# Patient Record
Sex: Female | Born: 1935 | Race: White | Hispanic: No | Marital: Married | Smoking: Former smoker
Health system: Southern US, Community
[De-identification: ages and names within clinical notes are randomized; demographics above are authoritative.]

## PROBLEM LIST (undated history)

## (undated) DIAGNOSIS — E039 Hypothyroidism, unspecified: Secondary | ICD-10-CM

## (undated) DIAGNOSIS — F0390 Unspecified dementia without behavioral disturbance: Secondary | ICD-10-CM

## (undated) DIAGNOSIS — H409 Unspecified glaucoma: Secondary | ICD-10-CM

## (undated) DIAGNOSIS — M810 Age-related osteoporosis without current pathological fracture: Secondary | ICD-10-CM

## (undated) DIAGNOSIS — E785 Hyperlipidemia, unspecified: Secondary | ICD-10-CM

## (undated) DIAGNOSIS — K219 Gastro-esophageal reflux disease without esophagitis: Secondary | ICD-10-CM

## (undated) DIAGNOSIS — I10 Essential (primary) hypertension: Secondary | ICD-10-CM

## (undated) DIAGNOSIS — N318 Other neuromuscular dysfunction of bladder: Secondary | ICD-10-CM

## (undated) DIAGNOSIS — L255 Unspecified contact dermatitis due to plants, except food: Secondary | ICD-10-CM

## (undated) DIAGNOSIS — M1990 Unspecified osteoarthritis, unspecified site: Secondary | ICD-10-CM

## (undated) DIAGNOSIS — H9190 Unspecified hearing loss, unspecified ear: Secondary | ICD-10-CM

## (undated) DIAGNOSIS — R32 Unspecified urinary incontinence: Secondary | ICD-10-CM

## (undated) DIAGNOSIS — K5730 Diverticulosis of large intestine without perforation or abscess without bleeding: Secondary | ICD-10-CM

## (undated) HISTORY — DX: Unspecified urinary incontinence: R32

## (undated) HISTORY — DX: Diverticulosis of large intestine without perforation or abscess without bleeding: K57.30

## (undated) HISTORY — PX: TOTAL KNEE ARTHROPLASTY: SHX125

## (undated) HISTORY — DX: Hyperlipidemia, unspecified: E78.5

## (undated) HISTORY — DX: Other neuromuscular dysfunction of bladder: N31.8

## (undated) HISTORY — PX: CORNEAL TRANSPLANT: SHX108

## (undated) HISTORY — DX: Hypothyroidism, unspecified: E03.9

## (undated) HISTORY — DX: Essential (primary) hypertension: I10

## (undated) HISTORY — DX: Unspecified hearing loss, unspecified ear: H91.90

## (undated) HISTORY — DX: Gastro-esophageal reflux disease without esophagitis: K21.9

## (undated) HISTORY — PX: ABDOMINAL HYSTERECTOMY: SHX81

## (undated) HISTORY — PX: KNEE ARTHROSCOPY: SHX127

## (undated) HISTORY — DX: Unspecified contact dermatitis due to plants, except food: L25.5

## (undated) HISTORY — DX: Age-related osteoporosis without current pathological fracture: M81.0

## (undated) HISTORY — PX: TONSILLECTOMY: SHX5217

## (undated) HISTORY — DX: Unspecified osteoarthritis, unspecified site: M19.90

---

## 1997-06-09 ENCOUNTER — Ambulatory Visit (HOSPITAL_COMMUNITY): Admission: RE | Admit: 1997-06-09 | Discharge: 1997-06-09 | Payer: Self-pay | Admitting: Specialist

## 2000-08-04 ENCOUNTER — Ambulatory Visit: Admission: RE | Admit: 2000-08-04 | Discharge: 2000-08-04 | Payer: Self-pay | Admitting: Orthopedic Surgery

## 2001-11-15 ENCOUNTER — Encounter: Payer: Self-pay | Admitting: Internal Medicine

## 2003-03-12 ENCOUNTER — Encounter: Payer: Self-pay | Admitting: Internal Medicine

## 2004-01-14 ENCOUNTER — Encounter: Payer: Self-pay | Admitting: Internal Medicine

## 2004-01-20 ENCOUNTER — Ambulatory Visit: Payer: Self-pay | Admitting: Internal Medicine

## 2004-05-11 ENCOUNTER — Ambulatory Visit: Payer: Self-pay | Admitting: Internal Medicine

## 2004-07-14 ENCOUNTER — Ambulatory Visit: Payer: Self-pay | Admitting: Internal Medicine

## 2004-10-29 ENCOUNTER — Ambulatory Visit: Payer: Self-pay | Admitting: Internal Medicine

## 2004-11-03 ENCOUNTER — Ambulatory Visit: Payer: Self-pay | Admitting: Internal Medicine

## 2004-12-17 ENCOUNTER — Ambulatory Visit: Payer: Self-pay | Admitting: Family Medicine

## 2005-01-01 ENCOUNTER — Ambulatory Visit: Payer: Self-pay | Admitting: Internal Medicine

## 2005-04-18 ENCOUNTER — Ambulatory Visit: Payer: Self-pay | Admitting: Internal Medicine

## 2005-04-26 ENCOUNTER — Ambulatory Visit: Payer: Self-pay | Admitting: Internal Medicine

## 2005-05-17 ENCOUNTER — Ambulatory Visit: Payer: Self-pay | Admitting: Internal Medicine

## 2005-06-15 ENCOUNTER — Inpatient Hospital Stay (HOSPITAL_COMMUNITY): Admission: RE | Admit: 2005-06-15 | Discharge: 2005-06-18 | Payer: Self-pay | Admitting: Orthopedic Surgery

## 2005-07-12 ENCOUNTER — Ambulatory Visit: Payer: Self-pay | Admitting: Internal Medicine

## 2005-08-08 ENCOUNTER — Ambulatory Visit: Payer: Self-pay | Admitting: Internal Medicine

## 2005-08-15 ENCOUNTER — Ambulatory Visit: Payer: Self-pay | Admitting: Internal Medicine

## 2005-09-06 ENCOUNTER — Ambulatory Visit: Payer: Self-pay | Admitting: Internal Medicine

## 2005-10-17 ENCOUNTER — Ambulatory Visit: Payer: Self-pay | Admitting: Internal Medicine

## 2005-12-28 ENCOUNTER — Ambulatory Visit: Payer: Self-pay | Admitting: Internal Medicine

## 2006-03-07 ENCOUNTER — Ambulatory Visit: Payer: Self-pay | Admitting: Internal Medicine

## 2006-06-15 ENCOUNTER — Ambulatory Visit (HOSPITAL_COMMUNITY): Admission: RE | Admit: 2006-06-15 | Discharge: 2006-06-15 | Payer: Self-pay | Admitting: Specialist

## 2006-07-11 ENCOUNTER — Ambulatory Visit: Payer: Self-pay | Admitting: Internal Medicine

## 2006-07-17 DIAGNOSIS — M810 Age-related osteoporosis without current pathological fracture: Secondary | ICD-10-CM | POA: Insufficient documentation

## 2006-07-17 DIAGNOSIS — E039 Hypothyroidism, unspecified: Secondary | ICD-10-CM | POA: Insufficient documentation

## 2006-07-17 HISTORY — DX: Hypothyroidism, unspecified: E03.9

## 2006-07-17 HISTORY — DX: Age-related osteoporosis without current pathological fracture: M81.0

## 2006-07-28 ENCOUNTER — Telehealth (INDEPENDENT_AMBULATORY_CARE_PROVIDER_SITE_OTHER): Payer: Self-pay

## 2006-08-04 ENCOUNTER — Ambulatory Visit: Payer: Self-pay | Admitting: Internal Medicine

## 2006-08-21 ENCOUNTER — Encounter: Payer: Self-pay | Admitting: Internal Medicine

## 2006-10-19 ENCOUNTER — Telehealth: Payer: Self-pay | Admitting: Internal Medicine

## 2006-11-16 ENCOUNTER — Ambulatory Visit: Payer: Self-pay | Admitting: Internal Medicine

## 2006-11-16 DIAGNOSIS — M1990 Unspecified osteoarthritis, unspecified site: Secondary | ICD-10-CM | POA: Insufficient documentation

## 2006-11-16 HISTORY — DX: Unspecified osteoarthritis, unspecified site: M19.90

## 2007-01-23 ENCOUNTER — Telehealth (INDEPENDENT_AMBULATORY_CARE_PROVIDER_SITE_OTHER): Payer: Self-pay | Admitting: *Deleted

## 2007-02-23 DIAGNOSIS — J069 Acute upper respiratory infection, unspecified: Secondary | ICD-10-CM | POA: Insufficient documentation

## 2007-03-01 ENCOUNTER — Ambulatory Visit: Payer: Self-pay | Admitting: Family Medicine

## 2007-03-15 ENCOUNTER — Telehealth: Payer: Self-pay | Admitting: Internal Medicine

## 2007-03-30 ENCOUNTER — Telehealth: Payer: Self-pay | Admitting: Internal Medicine

## 2007-05-04 ENCOUNTER — Ambulatory Visit: Payer: Self-pay | Admitting: Internal Medicine

## 2007-06-13 ENCOUNTER — Telehealth: Payer: Self-pay | Admitting: Internal Medicine

## 2007-07-13 ENCOUNTER — Ambulatory Visit: Payer: Self-pay | Admitting: Internal Medicine

## 2007-07-13 DIAGNOSIS — R1084 Generalized abdominal pain: Secondary | ICD-10-CM | POA: Insufficient documentation

## 2007-07-13 DIAGNOSIS — K5730 Diverticulosis of large intestine without perforation or abscess without bleeding: Secondary | ICD-10-CM | POA: Insufficient documentation

## 2007-07-13 HISTORY — DX: Diverticulosis of large intestine without perforation or abscess without bleeding: K57.30

## 2007-07-16 ENCOUNTER — Ambulatory Visit: Payer: Self-pay | Admitting: Cardiology

## 2007-07-16 ENCOUNTER — Ambulatory Visit: Payer: Self-pay | Admitting: Internal Medicine

## 2007-07-17 LAB — CONVERTED CEMR LAB
ALT: 15 units/L (ref 0–35)
Albumin: 3.3 g/dL — ABNORMAL LOW (ref 3.5–5.2)
Alkaline Phosphatase: 91 units/L (ref 39–117)
Amylase: 35 units/L (ref 27–131)
BUN: 13 mg/dL (ref 6–23)
Basophils Absolute: 0.1 10*3/uL (ref 0.0–0.1)
Basophils Relative: 0.9 % (ref 0.0–1.0)
Eosinophils Absolute: 0.1 10*3/uL (ref 0.0–0.7)
Eosinophils Relative: 1.9 % (ref 0.0–5.0)
GFR calc Af Amer: 90 mL/min
Glucose, Bld: 106 mg/dL — ABNORMAL HIGH (ref 70–99)
MCHC: 34 g/dL (ref 30.0–36.0)
MCV: 88.8 fL (ref 78.0–100.0)
Monocytes Relative: 10.4 % (ref 3.0–12.0)
Platelets: 385 10*3/uL (ref 150–400)
RDW: 12 % (ref 11.5–14.6)
Total Bilirubin: 0.4 mg/dL (ref 0.3–1.2)
Total Protein: 7 g/dL (ref 6.0–8.3)

## 2007-07-19 ENCOUNTER — Telehealth (INDEPENDENT_AMBULATORY_CARE_PROVIDER_SITE_OTHER): Payer: Self-pay

## 2007-07-19 ENCOUNTER — Ambulatory Visit: Payer: Self-pay | Admitting: Internal Medicine

## 2007-07-23 ENCOUNTER — Telehealth: Payer: Self-pay | Admitting: Internal Medicine

## 2007-07-27 ENCOUNTER — Telehealth: Payer: Self-pay | Admitting: Internal Medicine

## 2007-08-09 ENCOUNTER — Telehealth: Payer: Self-pay | Admitting: *Deleted

## 2007-08-10 ENCOUNTER — Ambulatory Visit: Payer: Self-pay | Admitting: Internal Medicine

## 2007-08-10 DIAGNOSIS — L255 Unspecified contact dermatitis due to plants, except food: Secondary | ICD-10-CM

## 2007-08-10 HISTORY — DX: Unspecified contact dermatitis due to plants, except food: L25.5

## 2007-09-27 ENCOUNTER — Telehealth: Payer: Self-pay | Admitting: Internal Medicine

## 2007-10-09 ENCOUNTER — Telehealth: Payer: Self-pay | Admitting: Internal Medicine

## 2007-10-31 ENCOUNTER — Telehealth (INDEPENDENT_AMBULATORY_CARE_PROVIDER_SITE_OTHER): Payer: Self-pay

## 2007-11-19 ENCOUNTER — Ambulatory Visit: Payer: Self-pay | Admitting: Internal Medicine

## 2007-11-19 DIAGNOSIS — E785 Hyperlipidemia, unspecified: Secondary | ICD-10-CM

## 2007-11-19 HISTORY — DX: Hyperlipidemia, unspecified: E78.5

## 2007-12-22 ENCOUNTER — Ambulatory Visit: Payer: Self-pay | Admitting: Family Medicine

## 2007-12-22 ENCOUNTER — Telehealth: Payer: Self-pay | Admitting: Family Medicine

## 2007-12-22 DIAGNOSIS — H01119 Allergic dermatitis of unspecified eye, unspecified eyelid: Secondary | ICD-10-CM | POA: Insufficient documentation

## 2008-02-12 ENCOUNTER — Ambulatory Visit: Payer: Self-pay | Admitting: Internal Medicine

## 2008-02-13 ENCOUNTER — Telehealth: Payer: Self-pay | Admitting: Internal Medicine

## 2008-02-21 ENCOUNTER — Telehealth: Payer: Self-pay | Admitting: Internal Medicine

## 2008-03-17 ENCOUNTER — Encounter: Payer: Self-pay | Admitting: Internal Medicine

## 2008-04-02 ENCOUNTER — Ambulatory Visit: Payer: Self-pay | Admitting: Internal Medicine

## 2008-04-11 ENCOUNTER — Telehealth: Payer: Self-pay | Admitting: Internal Medicine

## 2008-04-14 ENCOUNTER — Encounter: Payer: Self-pay | Admitting: Internal Medicine

## 2008-04-22 ENCOUNTER — Telehealth: Payer: Self-pay | Admitting: Internal Medicine

## 2008-04-23 ENCOUNTER — Ambulatory Visit: Payer: Self-pay | Admitting: Internal Medicine

## 2008-04-28 ENCOUNTER — Telehealth: Payer: Self-pay | Admitting: Internal Medicine

## 2008-08-07 ENCOUNTER — Emergency Department (HOSPITAL_COMMUNITY): Admission: EM | Admit: 2008-08-07 | Discharge: 2008-08-07 | Payer: Self-pay | Admitting: Family Medicine

## 2008-08-08 ENCOUNTER — Emergency Department (HOSPITAL_COMMUNITY): Admission: EM | Admit: 2008-08-08 | Discharge: 2008-08-08 | Payer: Self-pay | Admitting: Emergency Medicine

## 2008-08-10 ENCOUNTER — Emergency Department (HOSPITAL_COMMUNITY): Admission: EM | Admit: 2008-08-10 | Discharge: 2008-08-10 | Payer: Self-pay | Admitting: Family Medicine

## 2008-08-12 ENCOUNTER — Emergency Department (HOSPITAL_COMMUNITY): Admission: EM | Admit: 2008-08-12 | Discharge: 2008-08-12 | Payer: Self-pay | Admitting: Family Medicine

## 2008-08-12 ENCOUNTER — Telehealth: Payer: Self-pay | Admitting: Internal Medicine

## 2008-08-12 ENCOUNTER — Inpatient Hospital Stay (HOSPITAL_COMMUNITY): Admission: EM | Admit: 2008-08-12 | Discharge: 2008-08-14 | Payer: Self-pay | Admitting: Emergency Medicine

## 2008-08-13 ENCOUNTER — Ambulatory Visit: Payer: Self-pay | Admitting: Vascular Surgery

## 2008-08-13 ENCOUNTER — Encounter (INDEPENDENT_AMBULATORY_CARE_PROVIDER_SITE_OTHER): Payer: Self-pay | Admitting: Internal Medicine

## 2008-08-14 ENCOUNTER — Encounter (INDEPENDENT_AMBULATORY_CARE_PROVIDER_SITE_OTHER): Payer: Self-pay | Admitting: Internal Medicine

## 2008-08-21 ENCOUNTER — Ambulatory Visit: Payer: Self-pay | Admitting: Internal Medicine

## 2008-08-21 DIAGNOSIS — L02419 Cutaneous abscess of limb, unspecified: Secondary | ICD-10-CM

## 2008-08-21 DIAGNOSIS — L03119 Cellulitis of unspecified part of limb: Secondary | ICD-10-CM

## 2008-08-22 ENCOUNTER — Encounter: Payer: Self-pay | Admitting: Internal Medicine

## 2008-08-28 ENCOUNTER — Encounter: Payer: Self-pay | Admitting: Internal Medicine

## 2008-09-02 ENCOUNTER — Telehealth: Payer: Self-pay | Admitting: Internal Medicine

## 2008-09-11 ENCOUNTER — Telehealth: Payer: Self-pay | Admitting: Internal Medicine

## 2008-09-25 ENCOUNTER — Ambulatory Visit: Payer: Self-pay | Admitting: Internal Medicine

## 2008-09-25 DIAGNOSIS — K219 Gastro-esophageal reflux disease without esophagitis: Secondary | ICD-10-CM

## 2008-09-25 HISTORY — DX: Gastro-esophageal reflux disease without esophagitis: K21.9

## 2008-09-29 ENCOUNTER — Encounter (INDEPENDENT_AMBULATORY_CARE_PROVIDER_SITE_OTHER): Payer: Self-pay | Admitting: *Deleted

## 2008-12-04 ENCOUNTER — Encounter: Payer: Self-pay | Admitting: Internal Medicine

## 2008-12-09 ENCOUNTER — Ambulatory Visit: Payer: Self-pay | Admitting: Internal Medicine

## 2008-12-09 DIAGNOSIS — I10 Essential (primary) hypertension: Secondary | ICD-10-CM

## 2008-12-09 DIAGNOSIS — H9190 Unspecified hearing loss, unspecified ear: Secondary | ICD-10-CM

## 2008-12-09 HISTORY — DX: Unspecified hearing loss, unspecified ear: H91.90

## 2008-12-09 HISTORY — DX: Essential (primary) hypertension: I10

## 2008-12-15 ENCOUNTER — Ambulatory Visit: Payer: Self-pay | Admitting: Internal Medicine

## 2008-12-15 ENCOUNTER — Telehealth: Payer: Self-pay | Admitting: Internal Medicine

## 2008-12-15 LAB — CONVERTED CEMR LAB
Bilirubin Urine: NEGATIVE
Ketones, urine, test strip: NEGATIVE
Specific Gravity, Urine: 1.015
pH: 7

## 2008-12-16 ENCOUNTER — Telehealth (INDEPENDENT_AMBULATORY_CARE_PROVIDER_SITE_OTHER): Payer: Self-pay

## 2008-12-18 ENCOUNTER — Telehealth: Payer: Self-pay | Admitting: Internal Medicine

## 2009-03-04 ENCOUNTER — Encounter: Admission: RE | Admit: 2009-03-04 | Discharge: 2009-06-02 | Payer: Self-pay | Admitting: Family Medicine

## 2009-03-10 ENCOUNTER — Telehealth: Payer: Self-pay | Admitting: Internal Medicine

## 2009-04-15 ENCOUNTER — Ambulatory Visit: Payer: Self-pay | Admitting: Internal Medicine

## 2009-06-08 ENCOUNTER — Ambulatory Visit: Payer: Self-pay | Admitting: Internal Medicine

## 2009-06-08 DIAGNOSIS — N318 Other neuromuscular dysfunction of bladder: Secondary | ICD-10-CM

## 2009-06-08 DIAGNOSIS — H1030 Unspecified acute conjunctivitis, unspecified eye: Secondary | ICD-10-CM | POA: Insufficient documentation

## 2009-06-08 HISTORY — DX: Other neuromuscular dysfunction of bladder: N31.8

## 2009-06-09 ENCOUNTER — Encounter (INDEPENDENT_AMBULATORY_CARE_PROVIDER_SITE_OTHER): Payer: Self-pay | Admitting: *Deleted

## 2009-07-03 ENCOUNTER — Encounter (INDEPENDENT_AMBULATORY_CARE_PROVIDER_SITE_OTHER): Payer: Self-pay | Admitting: *Deleted

## 2009-07-08 ENCOUNTER — Telehealth: Payer: Self-pay | Admitting: Internal Medicine

## 2009-07-09 ENCOUNTER — Telehealth: Payer: Self-pay | Admitting: Internal Medicine

## 2009-07-13 ENCOUNTER — Telehealth: Payer: Self-pay | Admitting: Internal Medicine

## 2009-07-15 ENCOUNTER — Telehealth: Payer: Self-pay | Admitting: Internal Medicine

## 2009-09-02 ENCOUNTER — Ambulatory Visit: Payer: Self-pay | Admitting: Family Medicine

## 2009-09-02 DIAGNOSIS — R32 Unspecified urinary incontinence: Secondary | ICD-10-CM

## 2009-09-02 HISTORY — DX: Unspecified urinary incontinence: R32

## 2009-09-02 LAB — CONVERTED CEMR LAB
Glucose, Urine, Semiquant: NEGATIVE
Nitrite: NEGATIVE
Specific Gravity, Urine: 1.02
pH: 5

## 2009-09-03 ENCOUNTER — Encounter: Payer: Self-pay | Admitting: Internal Medicine

## 2009-09-04 ENCOUNTER — Telehealth: Payer: Self-pay | Admitting: Internal Medicine

## 2009-09-05 ENCOUNTER — Emergency Department (HOSPITAL_COMMUNITY): Admission: EM | Admit: 2009-09-05 | Discharge: 2009-09-05 | Payer: Self-pay | Admitting: Family Medicine

## 2009-09-15 ENCOUNTER — Telehealth: Payer: Self-pay | Admitting: Internal Medicine

## 2009-10-12 ENCOUNTER — Emergency Department (HOSPITAL_COMMUNITY): Admission: EM | Admit: 2009-10-12 | Discharge: 2009-10-13 | Payer: Self-pay | Admitting: Emergency Medicine

## 2009-10-14 ENCOUNTER — Ambulatory Visit: Payer: Self-pay | Admitting: Internal Medicine

## 2009-11-25 ENCOUNTER — Telehealth: Payer: Self-pay | Admitting: Internal Medicine

## 2009-12-15 ENCOUNTER — Ambulatory Visit: Payer: Self-pay | Admitting: Internal Medicine

## 2009-12-15 ENCOUNTER — Telehealth: Payer: Self-pay | Admitting: Internal Medicine

## 2009-12-15 ENCOUNTER — Encounter: Payer: Self-pay | Admitting: Internal Medicine

## 2009-12-15 LAB — CONVERTED CEMR LAB
ALT: 16 units/L (ref 0–35)
AST: 24 units/L (ref 0–37)
Basophils Absolute: 0 10*3/uL (ref 0.0–0.1)
CO2: 31 meq/L (ref 19–32)
Chloride: 104 meq/L (ref 96–112)
Cholesterol: 212 mg/dL — ABNORMAL HIGH (ref 0–200)
Creatinine, Ser: 0.7 mg/dL (ref 0.4–1.2)
Glucose, Bld: 101 mg/dL — ABNORMAL HIGH (ref 70–99)
HDL: 81.5 mg/dL (ref >39.00)
MCHC: 33.9 g/dL (ref 30.0–36.0)
MCV: 92.8 fL (ref 78.0–100.0)
Monocytes Absolute: 0.5 10*3/uL (ref 0.1–1.0)
Monocytes Relative: 7.4 % (ref 3.0–12.0)
Neutro Abs: 4.8 10*3/uL (ref 1.4–7.7)
Neutrophils Relative %: 65.2 % (ref 43.0–77.0)
Total Bilirubin: 0.4 mg/dL (ref 0.3–1.2)
VLDL: 16.6 mg/dL (ref 0.0–40.0)

## 2009-12-18 ENCOUNTER — Telehealth: Payer: Self-pay | Admitting: Internal Medicine

## 2009-12-30 ENCOUNTER — Telehealth: Payer: Self-pay | Admitting: Internal Medicine

## 2010-02-04 ENCOUNTER — Telehealth: Payer: Self-pay

## 2010-02-07 ENCOUNTER — Encounter: Payer: Self-pay | Admitting: Internal Medicine

## 2010-02-14 LAB — CONVERTED CEMR LAB
AST: 19 units/L (ref 0–37)
AST: 20 units/L (ref 0–37)
AST: 22 units/L (ref 0–37)
Albumin: 3.8 g/dL (ref 3.5–5.2)
Alkaline Phosphatase: 67 units/L (ref 39–117)
Alkaline Phosphatase: 74 units/L (ref 39–117)
BUN: 11 mg/dL (ref 6–23)
Basophils Absolute: 0 10*3/uL (ref 0.0–0.1)
Basophils Absolute: 0 10*3/uL (ref 0.0–0.1)
Basophils Relative: 0.5 % (ref 0.0–3.0)
Bilirubin, Direct: 0 mg/dL (ref 0.0–0.3)
Bilirubin, Direct: 0.1 mg/dL (ref 0.0–0.3)
Bilirubin, Direct: 0.1 mg/dL (ref 0.0–0.3)
CO2: 31 meq/L (ref 19–32)
Calcium: 9 mg/dL (ref 8.4–10.5)
Chloride: 106 meq/L (ref 96–112)
Chloride: 107 meq/L (ref 96–112)
Cholesterol: 259 mg/dL (ref 0–200)
Creatinine, Ser: 0.8 mg/dL (ref 0.4–1.2)
Direct LDL: 182.3 mg/dL
Eosinophils Absolute: 0.1 10*3/uL (ref 0.0–0.7)
Eosinophils Relative: 1 % (ref 0.0–5.0)
GFR calc Af Amer: 90 mL/min
GFR calc non Af Amer: 64.6 mL/min (ref >60)
GFR calc non Af Amer: 74 mL/min
Glucose, Bld: 123 mg/dL — ABNORMAL HIGH (ref 70–99)
HDL: 70.3 mg/dL (ref >39.0)
Hemoglobin: 13 g/dL (ref 12.0–15.0)
Hemoglobin: 13.3 g/dL (ref 12.0–15.0)
Lymphocytes Relative: 32.6 % (ref 12.0–46.0)
Lymphs Abs: 1.8 10*3/uL (ref 0.7–4.0)
MCHC: 33.6 g/dL (ref 30.0–36.0)
MCHC: 35 g/dL (ref 30.0–36.0)
MCV: 91.1 fL (ref 78.0–100.0)
MCV: 92.7 fL (ref 78.0–100.0)
Monocytes Absolute: 0.5 10*3/uL (ref 0.1–1.0)
Monocytes Absolute: 0.7 10*3/uL (ref 0.2–0.7)
Monocytes Relative: 8.9 % (ref 3.0–12.0)
Monocytes Relative: 9.5 % (ref 3.0–12.0)
Neutro Abs: 2.8 10*3/uL (ref 1.4–7.7)
Neutrophils Relative %: 53.4 % (ref 43.0–77.0)
Platelets: 248 10*3/uL (ref 150–400)
Potassium: 3.8 meq/L (ref 3.5–5.1)
Potassium: 4.9 meq/L (ref 3.5–5.1)
RBC: 4.15 M/uL (ref 3.87–5.11)
RBC: 4.26 M/uL (ref 3.87–5.11)
RDW: 12.2 % (ref 11.5–14.6)
TSH: 9.95 microintl units/mL — ABNORMAL HIGH (ref 0.35–5.50)
Total Bilirubin: 0.5 mg/dL (ref 0.3–1.2)
Total Bilirubin: 0.6 mg/dL (ref 0.3–1.2)
Total CHOL/HDL Ratio: 3
Total Protein: 6.6 g/dL (ref 6.0–8.3)
Total Protein: 6.8 g/dL (ref 6.0–8.3)
Triglycerides: 118 mg/dL (ref 0.0–149.0)
Triglycerides: 122 mg/dL (ref 0–149)
VLDL: 24 mg/dL (ref 0–40)
WBC: 5.2 10*3/uL (ref 4.5–10.5)

## 2010-02-16 ENCOUNTER — Telehealth: Payer: Self-pay | Admitting: Internal Medicine

## 2010-02-16 NOTE — Progress Notes (Signed)
Summary: sleeping med   Phone Note Call from Patient   Caller: Patient Call For: Gordy Savers  MD Reason for Call: Talk to Doctor Summary of Call: pt forgot to ask for sleeping med at appt today - would like something "light" . If ok - call out to [facility] [address]  please advise Initial call taken by: Duard Brady LPN,  December 15, 2009 10:15 AM  Follow-up for Phone Call        generic ambien 5 mg  #30 use HS as needed sleep Follow-up by: Gordy Savers  MD,  December 15, 2009 12:50 PM  Additional Follow-up for Phone Call Additional follow up Details #1::        med list changed - [facility] - attempt to call pt - no ans no mach. KIK Additional Follow-up by: Duard Brady LPN,  December 15, 2009 1:36 PM    New/Updated Medications: AMBIEN 5 MG TABS (ZOLPIDEM TARTRATE) 1 by mouth at bedtime as needed sleep Prescriptions: AMBIEN 5 MG TABS (ZOLPIDEM TARTRATE) 1 by mouth at bedtime as needed sleep  #30 x 0   Entered by:   Duard Brady LPN   Authorized by:   Gordy Savers  MD   Signed by:   Duard Brady LPN on 16/10/9602   Method used:   Historical   RxID:   [id number]

## 2010-02-16 NOTE — Progress Notes (Signed)
Summary: Pt req refill of generic Aricept 10mg  1 qd to [facility] Note Call from Patient Call back at [facility] Phone [phone]   Caller: Patient Summary of Call: Pt called and has been taken generic Aricept 10mg  1 qd  and pt says that it is helping her. Pt only has 4 pills lft. Pt is wanting to know know if she should continue taking med. If so, pt is req a script be called in to [facility] on [address].  Initial call taken by: Lucy Antigua,  November 25, 2009 3:22 PM  Follow-up for Phone Call        generic aricept 10 mg #90 one daily  RF 4 Follow-up by: Gordy Savers  MD,  November 26, 2009 8:22 AM  Additional Follow-up for Phone Call Additional follow up Details #1::        change to med list and [facility]. KIK Additional Follow-up by: Duard Brady LPN,  November 26, 2009 8:47 AM    New/Updated Medications: ARICEPT 10 MG TABS (DONEPEZIL HCL) qd Prescriptions: ARICEPT 10 MG TABS (DONEPEZIL HCL) qd  #90 x 4   Entered by:   Duard Brady LPN   Authorized by:   Gordy Savers  MD   Signed by:   Duard Brady LPN on 47/82/9562   Method used:   Faxed to ...       [facility]  [address]  [phone]* (retail)       [address]       [place], [place]  [address]       Ph: [phone] or [phone]       Fax: [phone]   RxID:   [id number]

## 2010-02-16 NOTE — Progress Notes (Signed)
Summary: rx for ditropan  Phone Note Call from Patient   Caller: Patient Call For: Gordy Savers  MD Summary of Call: needs rx for Ditropan called to [facility] on [address]. Vesicare samples gone , but to expensive.  [phone] Initial call taken by: Duard Brady LPN,  July 09, 2009 12:05 PM  Follow-up for Phone Call        1 box of samples given - pt aware Dr. Amador Cunas out of office until monday. KIK Follow-up by: Duard Brady LPN,  July 09, 2009 12:18 PM  Additional Follow-up for Phone Call Additional follow up Details #1::        ditropan xl 10 mg  #90 one every am  RF 4 Additional Follow-up by: Gordy Savers  MD,  July 12, 2009 8:17 PM    New/Updated Medications: DITROPAN XL 10 MG XR24H-TAB (OXYBUTYNIN CHLORIDE) qam Prescriptions: DITROPAN XL 10 MG XR24H-TAB (OXYBUTYNIN CHLORIDE) qam  #90 x 4   Entered by:   Duard Brady LPN   Authorized by:   Gordy Savers  MD   Signed by:   Duard Brady LPN on 29/52/8413   Method used:   Electronically to        [facility]  [phone]* (retail)       [address]       [place], [place]  [address]       Ph: [phone] or [phone]       Fax: [phone]   RxID:   [id number]   Appended Document: rx for ditropan med added to med list , [facility]. KIK

## 2010-02-16 NOTE — Letter (Signed)
Summary: Previsit letter  [facility]  [address] [place], [place] [address]   Phone: [phone]  Fax: [phone]       07/03/2009 MRN: [id number]  [name] [address] [place], [place]  [address]  Dear Ms. Linnen,  Welcome to the Gastroenterology Division at [facility].    You are scheduled to see a nurse for your pre-procedure visit on 07-24-09 at 1:30p.m. on the 3rd floor at [facility], [address].  We ask that you try to arrive at our office 15 minutes prior to your appointment time to allow for check-in.  Your nurse visit will consist of discussing your medical and surgical history, your immediate family medical history, and your medications.    Please bring a complete list of all your medications or, if you prefer, bring the medication bottles and we will list them.  We will need to be aware of both prescribed and over the counter drugs.  We will need to know exact dosage information as well.  If you are on blood thinners (Coumadin, Plavix, Aggrenox, Ticlid, etc.) please call our office today/prior to your appointment, as we need to consult with your physician about holding your medication.   Please be prepared to read and sign documents such as consent forms, a financial agreement, and acknowledgement forms.  If necessary, and with your consent, a friend or relative is welcome to sit-in on the nurse visit with you.  Please bring your insurance card so that we may make a copy of it.  If your insurance requires a referral to see a specialist, please bring your referral form from your primary care physician.  No co-pay is required for this nurse visit.     If you cannot keep your appointment, please call [phone] to cancel or reschedule prior to your appointment date.  This allows [name] the opportunity to schedule an appointment for another patient in need of care.    Thank you for choosing [facility] for your medical needs.  We  appreciate the opportunity to care for you.  Please visit [name] at our website  to learn more about our practice.                     Sincerely.                                                                                                                   The Gastroenterology Division

## 2010-02-16 NOTE — Assessment & Plan Note (Signed)
Summary: pt will come in fasting/njr   Vital Signs:  Patient profile:   77 year old female Height:      59.5 inches Weight:      125 pounds BMI:     24.91 Temp:     98.1 degrees F oral BP sitting:   120 / 70  (left arm) Cuff size:   large  Vitals Entered By: Duard Brady LPN (December 15, 2009 8:48 AM) CC: cpx - doing ok Is Patient Diabetic? No   CC:  cpx - doing ok.  History of Present Illness: 77 year old patient who is seen today for a comprehensive evaluation.  Medical problems include dyslipidemia, gastroesophageal reflux disease, and hypothyroidism.  She also has a history of overactive bladder, and mild dementia.  She is followed [facility] due to glaucoma. Here for Medicare AWV:  1.   Risk factors based on Past M, S, F history:  bivascular risk factors includedyslipidemia, and hypertension 2.   Physical Activities: remains very active.  No exercise limitations 3.   Depression/mood: no history of depression or mood disorder 4.   Hearing: moderately impaired ; uses a hearing aid on the left 5.   ADL's: independent in all aspects of daily living 6.   Fall Risk: moderate 7.   Home Safety: no problems identified 8.   Height, weight, &visual acuity:height and weight are stable.  She is on biphosphonate for osteo- porosis.  Is followed closely by ophthalmology 9.   Counseling: exercise heart healthy diet.  Discussed 10.   Labs ordered based on risk factors: laboratory profile, including lipid panel, and TSH will be reviewed. 11.           Referral Coordination- not appropriate at this time 12.           Care Plan- heart healthy diet, calcium and vitamin D supplementation, exercise regimen.  All encouraged 13.            Cognitive Assessment- patient has a history of mild cognitive impairment.  Treated with Aricept   Allergies: 1)  ! Codeine 2)  ! * Plastic Adhesive  Past History:  Past Medical History: Reviewed history from 12/09/2008 and no changes  required. Hypothyroidism Osteoporosis DJD Mild Hypercholesterolemia Menopausal syndrome Osteoarthritis Diverticulosis, colon peripheral vascular occlusive diseaseand glaucoma chronic microvascular changes of the brain history of polysubstance abuse Hyperlipidemia Overactive Bladder partial deafness Hypertension  Past Surgical History: Reviewed history from 11/19/2007 and no changes required. Hysterectomy-1982 Tonsillectomy Rt knee arthroscopic sx-2004 G 1 P 1 Lt corneal implant Colonoscopy- 11/15/2001 multiple corneal transplants left eye right totally replacement surgery, November 2007  Family History: Reviewed history from 11/16/2006 and no changes required. Fam hx COPD Fam hx MI Fam hx Brain stem stroke  father died age 45, MI mother died age 22 complications of COPD, history of osteoporosis  One brother died age 51 brainstem stroke one sister is 69 in good health  Social History: Reviewed history from 11/19/2007 and no changes required. Former Smoker Married  Review of Systems  The patient denies anorexia, fever, weight loss, weight gain, vision loss, decreased hearing, hoarseness, chest pain, syncope, dyspnea on exertion, peripheral edema, prolonged cough, headaches, hemoptysis, abdominal pain, melena, hematochezia, severe indigestion/heartburn, hematuria, incontinence, genital sores, muscle weakness, suspicious skin lesions, transient blindness, difficulty walking, depression, unusual weight change, abnormal bleeding, enlarged lymph nodes, angioedema, and breast masses.    Physical Exam  General:  Well-developed,well-nourished,in no acute distress; alert,appropriate and cooperative throughout examination; blood pressure 120/70 Head:  Normocephalic and atraumatic without obvious abnormalities. No apparent alopecia or balding. Eyes:  No corneal or conjunctival inflammation noted. EOMI. Perrla. Funduscopic exam benign, without hemorrhages, exudates or  papilledema. Vision grossly normal. Ears:  External ear exam shows no significant lesions or deformities.  Otoscopic examination reveals clear canals, tympanic membranes are intact bilaterally without bulging, retraction, inflammation or discharge. hearing aid present on the left Nose:  External nasal examination shows no deformity or inflammation. Nasal mucosa are pink and moist without lesions or exudates. Mouth:  Oral mucosa and oropharynx without lesions or exudates.  Teeth in good repair. Neck:  No deformities, masses, or tenderness noted. Chest Wall:  No deformities, masses, or tenderness noted. Breasts:  No mass, nodules, thickening, tenderness, bulging, retraction, inflamation, nipple discharge or skin changes noted.   Lungs:  Normal respiratory effort, chest expands symmetrically. Lungs are clear to auscultation, no crackles or wheezes. Heart:  Normal rate and regular rhythm. S1 and S2 normal without gallop, murmur, click, rub or other extra sounds. Rectal:  No external abnormalities noted. Normal sphincter tone. No rectal masses or tenderness. Genitalia:  status post hysterectomy Msk:  No deformity or scoliosis noted of thoracic or lumbar spine.   Pulses:  R and L carotid,radial,femoral,dorsalis pedis and posterior tibial pulses are full and equal bilaterally Extremities:  No clubbing, cyanosis, edema, or deformity noted with normal full range of motion of all joints.   Neurologic:  No cranial nerve deficits noted. Station and gait are normal. Plantar reflexes are down-going bilaterally. DTRs are symmetrical throughout. Sensory, motor and coordinative functions appear intact. Skin:  Intact without suspicious lesions or rashes Cervical Nodes:  No lymphadenopathy noted Axillary Nodes:  No palpable lymphadenopathy Inguinal Nodes:  No significant adenopathy Psych:  Cognition and judgment appear intact. Alert and cooperative with normal attention span and concentration. No apparent delusions,  illusions, hallucinations   Impression & Recommendations:  Problem # 1:  PREVENTIVE HEALTH CARE (ICD-V70.0)  Orders: Medicare -1st Annual Wellness Visit [phone])  Problem # 2:  HYPERTENSION (ICD-401.9)  The following medications were removed from the medication list:    Amlodipine Besylate 2.5 Mg Tabs (Amlodipine besylate) ..... One daily  Orders: EKG w/ Interpretation (93000) Venipuncture (96295) TLB-BMP (Basic Metabolic Panel-BMET) (80048-METABOL) TLB-CBC Platelet - w/Differential (85025-CBCD) TLB-Hepatic/Liver Function Pnl (80076-HEPATIC)  The following medications were removed from the medication list:    Amlodipine Besylate 2.5 Mg Tabs (Amlodipine besylate) ..... One daily  Problem # 3:  OSTEOARTHRITIS (ICD-715.90)  Her updated medication list for this problem includes:    Tylenol Extra Strength 500 Mg Tabs (Acetaminophen) .Marland Kitchen... Three times a day    Ibuprofen 200 Mg Tabs (Ibuprofen) .Marland Kitchen... Three times a day as needed    Aspirin 81 Mg Tbec (Aspirin) ..... Once daily  Her updated medication list for this problem includes:    Tylenol Extra Strength 500 Mg Tabs (Acetaminophen) .Marland Kitchen... Three times a day    Ibuprofen 200 Mg Tabs (Ibuprofen) .Marland Kitchen... Three times a day as needed    Aspirin 81 Mg Tbec (Aspirin) ..... Once daily  Orders: Venipuncture (28413) TLB-BMP (Basic Metabolic Panel-BMET) (80048-METABOL) TLB-CBC Platelet - w/Differential (85025-CBCD) TLB-Hepatic/Liver Function Pnl (80076-HEPATIC)  Problem # 4:  HYPOTHYROIDISM (ICD-244.9)  Her updated medication list for this problem includes:    Synthroid 100 Mcg Tabs (Levothyroxine sodium) .Marland Kitchen... 1 once daily  Her updated medication list for this problem includes:    Synthroid 100 Mcg Tabs (Levothyroxine sodium) .Marland Kitchen... 1 once daily  Orders: Venipuncture (24401) TLB-BMP (Basic Metabolic Panel-BMET) (  80048-METABOL) TLB-CBC Platelet - w/Differential (85025-CBCD) TLB-Hepatic/Liver Function Pnl (80076-HEPATIC) TLB-TSH  (Thyroid Stimulating Hormone) (84443-TSH)  Problem # 5:  OVERACTIVE BLADDER (ICD-596.51)  Problem # 6:  HYPERLIPIDEMIA (ICD-272.4)  Her updated medication list for this problem includes:    Simvastatin 10 Mg Tabs (Simvastatin) .Marland Kitchen... Take 1 tablet by mouth at bedtime  Orders: EKG w/ Interpretation (93000) TLB-Lipid Panel (80061-LIPID)  Her updated medication list for this problem includes:    Simvastatin 10 Mg Tabs (Simvastatin) .Marland Kitchen... Take 1 tablet by mouth at bedtime  Complete Medication List: 1)  Allegra 180 Mg Tabs (Fexofenadine hcl) .Marland Kitchen.. 1 once daily as needed 2)  Alendronate Sodium 70 Mg Tabs (Alendronate sodium) .... One by mouth weekly 3)  Miralax Powd (Polyethylene glycol 3350) .... One  scoop in 12oz water twice daily as needed 4)  Tylenol Extra Strength 500 Mg Tabs (Acetaminophen) .... Three times a day 5)  Ibuprofen 200 Mg Tabs (Ibuprofen) .... Three times a day as needed 6)  Simvastatin 10 Mg Tabs (Simvastatin) .... Take 1 tablet by mouth at bedtime 7)  Aspirin 81 Mg Tbec (Aspirin) .... Once daily 8)  Calcium 500 +d 500-400 Mg-unit Tabs (Calcium-vitamin d) .... Two times a day 9)  Melatonin 3 Mg Tabs (Melatonin) .... Take 1 tablet by mouth once a day 10)  Multivitamins Tabs (Multiple vitamin) .... Once daily 11)  Fish Oil 300 Mg Caps (Omega-3 fatty acids) .... Takes 1000iu once daily 12)  Vitamin D3 1000 Unit Caps (Cholecalciferol) .... Once daily 13)  Synthroid 100 Mcg Tabs (Levothyroxine sodium) .Marland Kitchen.. 1 once daily 14)  Ditropan Xl 10 Mg Xr24h-tab (Oxybutynin chloride) .... Qam 15)  Aricept 10 Mg Tabs (Donepezil hcl) .... Qd  Patient Instructions: 1)  Please schedule a follow-up appointment in 6 months. 2)  Limit your Sodium (Salt). 3)  It is important that you exercise regularly at least 20 minutes 5 times a week. If you develop chest pain, have severe difficulty breathing, or feel very tired , stop exercising immediately and seek medical attention. 4)  Take calcium  +Vitamin D daily. Prescriptions: ARICEPT 10 MG TABS (DONEPEZIL HCL) qd  #90 x 6   Entered and Authorized by:   Gordy Savers  MD   Signed by:   Gordy Savers  MD on 12/15/2009   Method used:   Electronically to        [facility]  [phone]* (retail)       [address]       [place], [place]  [address]       Ph: [phone] or [phone]       Fax: [phone]   RxID:   [id number] DITROPAN XL 10 MG XR24H-TAB (OXYBUTYNIN CHLORIDE) qam  #90 x 6   Entered and Authorized by:   Gordy Savers  MD   Signed by:   Gordy Savers  MD on 12/15/2009   Method used:   Electronically to        [facility]  [phone]* (retail)       [address]       [place], [place]  [address]       Ph: [phone] or [phone]       Fax: [phone]   RxID:   [id number] SYNTHROID 100 MCG TABS (LEVOTHYROXINE SODIUM) 1 once daily  #90 x 6   Entered and Authorized by:  Gordy Savers  MD   Signed by:   Gordy Savers  MD on 12/15/2009   Method used:   Electronically to        [facility]  [phone]* (retail)       [address]       [place], [place]  [address]       Ph: [phone] or [phone]       Fax: [phone]   RxID:   [id number] SIMVASTATIN 10 MG TABS (SIMVASTATIN) Take 1 tablet by mouth at bedtime  #90 x 6   Entered and Authorized by:   Gordy Savers  MD   Signed by:   Gordy Savers  MD on 12/15/2009   Method used:   Electronically to        [facility]  [phone]* (retail)       [address]       [place], [place]  [address]       Ph: [phone] or [phone]       Fax: [phone]   RxID:   [id number]    Orders Added: 1)  EKG w/ Interpretation [93000] 2)  Medicare -1st Annual Wellness Visit [G0438] 3)  Est. Patient Level III [83151] 4)  Venipuncture [76160] 5)  TLB-Lipid  Panel [80061-LIPID] 6)  TLB-BMP (Basic Metabolic Panel-BMET) [80048-METABOL] 7)  TLB-CBC Platelet - w/Differential [85025-CBCD] 8)  TLB-Hepatic/Liver Function Pnl [80076-HEPATIC] 9)  TLB-TSH (Thyroid Stimulating Hormone) [73710-GYI]

## 2010-02-16 NOTE — Progress Notes (Signed)
Summary: med question  Phone Note Call from Patient Call back at Home Phone [phone]   Caller: Patient---live call Summary of Call: On Oxybutynin 10mg  1qm. pt says that it is too strong. She would like to know if she can cut in half and 5 mg 1qm? She stated that she been cutting them in half for the past 2 days. Is this ok? Initial call taken by: Warnell Forester,  July 15, 2009 12:13 PM  Follow-up for Phone Call        ok Follow-up by: Gordy Savers  MD,  July 15, 2009 12:46 PM  Additional Follow-up for Phone Call Additional follow up Details #1::        lmaom as indicated above. Additional Follow-up by: Warnell Forester,  July 15, 2009 12:50 PM

## 2010-02-16 NOTE — Assessment & Plan Note (Signed)
Summary: FLU INJ/PS   Nurse Visit   Allergies: 1)  ! Codeine 2)  ! * Plastic Adhesive  Immunizations Administered:  Influenza Vaccine # 1:    Vaccine Type: Fluvax MCR    Site: left deltoid    Mfr: GlaxoSmithKline    Dose: 0.5 ml    Route: IM    Given by: Kern Reap CMA (AAMA)    Exp. Date: 07/17/2010    Lot #: [id number]    VIS given: 08/11/09 version given October 14, 2009.    Physician counseled: yes  Orders Added: 1)  Influenza Vaccine MCR [00025]

## 2010-02-16 NOTE — Letter (Signed)
Summary: Previsit letter  [facility]  [address] [place], [place] [address]   Phone: [phone]  Fax: [phone]       06/09/2009 MRN: [id number]  [name] [address] [place], [place]  [address]  Dear [name],  Welcome to the Gastroenterology Division at [facility].    You are scheduled to see a nurse for your pre-procedure visit on 07/22/2009 at 8:00am on the 3rd floor at [facility], [address].  We ask that you try to arrive at our office 15 minutes prior to your appointment time to allow for check-in.  Your nurse visit will consist of discussing your medical and surgical history, your immediate family medical history, and your medications.    Please bring a complete list of all your medications or, if you prefer, bring the medication bottles and we will list them.  We will need to be aware of both prescribed and over the counter drugs.  We will need to know exact dosage information as well.  If you are on blood thinners (Coumadin, Plavix, Aggrenox, Ticlid, etc.) please call our office today/prior to your appointment, as we need to consult with your physician about holding your medication.   Please be prepared to read and sign documents such as consent forms, a financial agreement, and acknowledgement forms.  If necessary, and with your consent, a friend or relative is welcome to sit-in on the nurse visit with you.  Please bring your insurance card so that we may make a copy of it.  If your insurance requires a referral to see a specialist, please bring your referral form from your primary care physician.  No co-pay is required for this nurse visit.     If you cannot keep your appointment, please call [phone] to cancel or reschedule prior to your appointment date.  This allows [name] the opportunity to schedule an appointment for another patient in need of care.    Thank you for choosing [facility] for your medical needs.  We  appreciate the opportunity to care for you.  Please visit [name] at our website  to learn more about our practice.                     Sincerely.                                                                                                                   The Gastroenterology Division

## 2010-02-16 NOTE — Progress Notes (Signed)
Summary: refax to [facility] simvastatin  Phone Note Refill Request Message from:  Fax from Pharmacy on December 18, 2009 3:11 PM  Refills Requested: Medication #1:  SIMVASTATIN 10 MG TABS Take 1 tablet by mouth at bedtime [facility]   Method Requested: Fax to Local Pharmacy Initial call taken by: Duard Brady LPN,  December 18, 2009 3:11 PM  Follow-up for Phone Call        was sent to [facility] at 11/29 OV - will refax to [facility]    KIK Follow-up by: Duard Brady LPN,  December 18, 2009 3:13 PM

## 2010-02-16 NOTE — Progress Notes (Signed)
Summary: Pt req a refill of Cipro 250mg  to [facility] Note Call from Patient Call back at [facility] Phone [phone]   Caller: Patient Summary of Call: Pt called req UTI, Dr. Rogelia Rohrer prescribed 3 days worth of med, but pt is req Ciprofloxacin 250mg . Pls call in to [facility] on [address]. Pt is out of med and is needing more. Pt says she still has uti.    Pt is req to be notified.  Initial call taken by: Lucy Antigua,  September 04, 2009 4:14 PM  Follow-up for Phone Call        #14 Follow-up by: Gordy Savers  MD,  September 04, 2009 5:31 PM  Additional Follow-up for Phone Call Additional follow up Details #1::        pt aware rx sent to [facility]. kik Additional Follow-up by: Duard Brady LPN,  September 04, 2009 5:41 PM    Prescriptions: CIPRO 500 MG TABS (CIPROFLOXACIN HCL) 1 by mouth two times a day for 7 days  #14 x 0   Entered by:   Duard Brady LPN   Authorized by:   Gordy Savers  MD   Signed by:   Duard Brady LPN on 32/44/0102   Method used:   Electronically to        [facility]  [phone]* (retail)       [address]       [place], [place]  [address]       Ph: [phone] or [phone]       Fax: [phone]   RxID:   [id number]

## 2010-02-16 NOTE — Progress Notes (Signed)
Summary: samples  Phone Note Call from Patient Call back at Home Phone [phone]   Caller: Patient--live call Summary of Call: need more samples of Vesicare. willmake apt. Initial call taken by: Warnell Forester,  July 08, 2009 2:20 PM  Follow-up for Phone Call        see phone note from 6/23 KIK Follow-up by: Duard Brady LPN,  July 09, 2009 12:12 PM

## 2010-02-16 NOTE — Progress Notes (Signed)
Summary: refill  Phone Note Refill Request Message from:  Fax from Pharmacy  Refills Requested: Medication #1:  SYNTHROID 100 MCG TABS 1 once daily   Brand Name Necessary? No [facility] fax---[phone]  Initial call taken by: Warnell Forester,  March 10, 2009 9:12 AM    Prescriptions: SYNTHROID 100 MCG TABS (LEVOTHYROXINE SODIUM) 1 once daily  #90 x 3   Entered by:   Duard Brady LPN   Authorized by:   Gordy Savers  MD   Signed by:   Duard Brady LPN on 98/11/9145   Method used:   Electronically to        [facility]* (mail-order)             ,          Ph: [phone]       Fax: [phone]   RxID:   [id number]

## 2010-02-16 NOTE — Progress Notes (Signed)
Summary: cipro for uti  Phone Note Call from Patient Call back at Home Phone [phone]   Caller: Patient Summary of Call: Pt called to get a refill of that med that was prescribed for uti. Pt is req this to be called in to [facility] on [address].    Initial call taken by: Lucy Antigua,  July 13, 2009 11:53 AM  Follow-up for Phone Call        called pt to discuss signs - c/o itching and burning with urination. states it feels like the other infections i have had before.  Will forward to Dr. Amador Cunas to see if he will call out med.   Pt is also aware that we called otu ditropan rx this AM too.  Follow-up by: Duard Brady LPN,  July 13, 2009 12:09 PM  Additional Follow-up for Phone Call Additional follow up Details #1::        generic cipro  500 #14 one BID Additional Follow-up by: Gordy Savers  MD,  July 13, 2009 1:00 PM    Additional Follow-up for Phone Call Additional follow up Details #2::    added to med list , efilled to [facility] - attempt to call pt - no ans no mach.  KIK Follow-up by: Duard Brady LPN,  July 13, 2009 3:52 PM  Additional Follow-up for Phone Call Additional follow up Details #3:: Details for Additional Follow-up Action Taken: attempt to call - ans mach - LMTCB  med called to [facility]. KIK  New/Updated Medications: CIPRO 500 MG TABS (CIPROFLOXACIN HCL) 1 by mouth two times a day for 7 days Prescriptions: CIPRO 500 MG TABS (CIPROFLOXACIN HCL) 1 by mouth two times a day for 7 days  #14 x 0   Entered by:   Duard Brady LPN   Authorized by:   Gordy Savers  MD   Signed by:   Duard Brady LPN on 40/34/7425   Method used:   Electronically to        [facility]  [phone]* (retail)       [address]       [place], [place]  [address]       Ph: [phone] or [phone]       Fax: [phone]   RxID:   [id number]

## 2010-02-16 NOTE — Assessment & Plan Note (Signed)
Summary: med check/consult re: nexium/cjr   Vital Signs:  Patient profile:   77 year old female Weight:      128 pounds Temp:     97.4 degrees F BP sitting:   130 / 74  (right arm) Cuff size:   regular  Vitals Entered By: Duard Brady LPN (April 15, 2009 10:53 AM) CC: medication review needs refills Is Patient Diabetic? No   CC:  medication review needs refills.  History of Present Illness: 77 year old patient who is seen today for follow-up.  She has a history of treated hypertension, dyslipidemia, and gastroesophageal reflux disease.  She did not have very good results on call Metrazol and has done quite well on Nexium.  In spite of a higher co-pay she wishes to have this medication prescribed.  She is scheduled for a complete physical in approximately 5 to 6 weeks  Allergies: 1)  ! Codeine 2)  ! * Plastic Adhesive  Past History:  Past Medical History: Reviewed history from 12/09/2008 and no changes required. 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 No apparent alopecia or balding. Eyes:  No corneal or conjunctival inflammation noted. EOMI. Perrla. Funduscopic exam benign, without hemorrhages, exudates or papilledema. Vision grossly normal. Mouth:  Oral mucosa and oropharynx without lesions or exudates.  Teeth in good repair. Neck:  No deformities, masses, or tenderness noted. Lungs:  Normal respiratory effort, chest expands symmetrically. Lungs are clear to auscultation, no crackles or  wheezes. Heart:  Normal rate and regular rhythm. S1 and S2 normal without gallop, murmur, click, rub or other extra sounds. Abdomen:  Bowel sounds positive,abdomen soft and non-tender without masses, organomegaly or hernias noted.   Impression & Recommendations:  Problem # 1:  HYPERTENSION (ICD-401.9)  Her updated medication list for this problem includes:    Amlodipine Besylate 2.5 Mg Tabs (Amlodipine besylate) ..... One daily  Her updated medication list for this problem includes:    Amlodipine Besylate 2.5 Mg Tabs (Amlodipine besylate) ..... One daily  Problem # 2:  GERD (ICD-530.81)  The following medications were removed from the medication list:    Prilosec Otc 20 Mg Tbec (Omeprazole magnesium) ..... Once daily Her updated medication list for this problem includes:    Nexium 40 Mg Cpdr (Esomeprazole magnesium) ..... One daily  as needed  The following medications were removed from the medication list:    Prilosec Otc 20 Mg Tbec (Omeprazole magnesium) ..... Once daily Her updated medication list for this problem includes:    Nexium 40 Mg Cpdr (Esomeprazole magnesium) ..... One daily  as needed  Complete Medication List: 1)  Allegra 180 Mg Tabs (Fexofenadine hcl) .Marland Kitchen.. 1 once daily as needed 2)  Alendronate Sodium 70 Mg Tabs (Alendronate sodium) .... One by mouth weekly 3)  Miralax Powd (Polyethylene glycol 3350) .... One  scoop in 12oz water twice daily as needed 4)  Tylenol Extra Strength 500 Mg Tabs (Acetaminophen) .... Three times a day 5)  Ibuprofen 200 Mg Tabs (Ibuprofen) .... Three times a day as needed 6)  Simvastatin 10 Mg Tabs (Simvastatin) .... Take 1 tablet by mouth at bedtime 7)  Aspirin 81 Mg Tbec (Aspirin) .... Once daily 8)  Calcium 500 +d 500-400 Mg-unit Tabs (Calcium-vitamin d) .Marland KitchenMarland KitchenMarland Kitchen  Two times a day 9)  Melatonin 3 Mg Tabs (Melatonin) .... Take 1 tablet by mouth once a day 10)  Multivitamins Tabs (Multiple vitamin) .... Once daily 11)  Fish Oil 300 Mg Caps  (Omega-3 fatty acids) .... Takes 1000iu once daily 12)  Vitamin D3 1000 Unit Caps (Cholecalciferol) .... Once daily 13)  Vitamin C 500 Mg Tabs (Ascorbic acid) .... Once daily 14)  Amlodipine Besylate 2.5 Mg Tabs (Amlodipine besylate) .... One daily 15)  Lorazepam 0.5 Mg Tabs (Lorazepam) .... One twice daily as needed for anxiety 16)  Synthroid 100 Mcg Tabs (Levothyroxine sodium) .Marland Kitchen.. 1 once daily 17)  Nexium 40 Mg Cpdr (Esomeprazole magnesium) .... One daily  as needed  Patient Instructions: 1)  Please schedule a follow-up appointment as needed. Prescriptions: LORAZEPAM 0.5 MG TABS (LORAZEPAM) one twice daily as needed for anxiety  #50 x 2   Entered and Authorized by:   Gordy Savers  MD   Signed by:   Gordy Savers  MD on 04/15/2009   Method used:   Print then Give to Patient   RxID:   [id number] NEXIUM 40 MG CPDR (ESOMEPRAZOLE MAGNESIUM) one daily  as needed  #90 x 6   Entered and Authorized by:   Gordy Savers  MD   Signed by:   Gordy Savers  MD on 04/15/2009   Method used:   Print then Give to Patient   RxID:   [id number] SYNTHROID 100 MCG TABS (LEVOTHYROXINE SODIUM) 1 once daily  #90 x 3   Entered and Authorized by:   Gordy Savers  MD   Signed by:   Gordy Savers  MD on 04/15/2009   Method used:   Print then Give to Patient   RxID:   [id number] AMLODIPINE BESYLATE 2.5 MG TABS (AMLODIPINE BESYLATE) one daily  #90 x 6   Entered and Authorized by:   Gordy Savers  MD   Signed by:   Gordy Savers  MD on 04/15/2009   Method used:   Print then Give to Patient   RxID:   [id number] SIMVASTATIN 10 MG TABS (SIMVASTATIN) Take 1 tablet by mouth at bedtime  #90 x 3   Entered and Authorized by:   Gordy Savers  MD   Signed by:   Gordy Savers  MD on 04/15/2009   Method used:   Print then Give to Patient   RxID:   [id number] ALENDRONATE SODIUM 70 MG  TABS (ALENDRONATE SODIUM) one by mouth weekly  #12  Each x 5   Entered and Authorized by:   Gordy Savers  MD   Signed by:   Gordy Savers  MD on 04/15/2009   Method used:   Print then Give to Patient   RxID:   [id number] ALLEGRA 180 MG TABS (FEXOFENADINE HCL) 1 once daily as needed  #90 x 6   Entered and Authorized by:   Gordy Savers  MD   Signed by:   Gordy Savers  MD on 04/15/2009   Method used:   Print then Give to Patient   RxID:   [id number] NEXIUM 40 MG CPDR (ESOMEPRAZOLE MAGNESIUM) one daily  as needed  #90 x 6   Entered and Authorized by:   Gordy Savers  MD   Signed by:   Gordy Savers  MD on 04/15/2009   Method used:   Electronically to        [facility]* (  mail-order)             ,          Ph: [phone]       Fax: [phone]   RxID:   [id number] SYNTHROID 100 MCG TABS (LEVOTHYROXINE SODIUM) 1 once daily  #90 x 3   Entered and Authorized by:   Gordy Savers  MD   Signed by:   Gordy Savers  MD on 04/15/2009   Method used:   Electronically to        [facility]* (mail-order)             ,          Ph: [phone]       Fax: [phone]   RxID:   [id number] AMLODIPINE BESYLATE 2.5 MG TABS (AMLODIPINE BESYLATE) one daily  #90 x 6   Entered and Authorized by:   Gordy Savers  MD   Signed by:   Gordy Savers  MD on 04/15/2009   Method used:   Electronically to        [facility]* (mail-order)             ,          Ph: [phone]       Fax: [phone]   RxID:   [id number] SIMVASTATIN 10 MG TABS (SIMVASTATIN) Take 1 tablet by mouth at bedtime  #90 x 3   Entered and Authorized by:   Gordy Savers  MD   Signed by:   Gordy Savers  MD on 04/15/2009   Method used:   Electronically to        [facility]* (mail-order)             ,          Ph: [phone]       Fax: [phone]   RxID:   [id number] ALENDRONATE SODIUM 70 MG  TABS (ALENDRONATE SODIUM) one by mouth weekly  #12 Each x 5    Entered and Authorized by:   Gordy Savers  MD   Signed by:   Gordy Savers  MD on 04/15/2009   Method used:   Electronically to        [facility]* (mail-order)             ,          Ph: [phone]       Fax: [phone]   RxID:   [id number] ALLEGRA 180 MG TABS (FEXOFENADINE HCL) 1 once daily as needed  #90 x 6   Entered and Authorized by:   Gordy Savers  MD   Signed by:   Gordy Savers  MD on 04/15/2009   Method used:   Electronically to        [facility]* (mail-order)             ,          Ph: [phone]       Fax: [phone]   RxID:   [id number]

## 2010-02-16 NOTE — Assessment & Plan Note (Signed)
Summary: UTI/dm   Vital Signs:  Patient profile:   77 year old female Height:      60 inches (152.40 cm) Weight:      123 pounds (55.91 kg) BMI:     24.11 O2 Sat:      98 % on Room air Temp:     98.4 degrees F (36.89 degrees C) oral Pulse rate:   73 / minute BP sitting:   152 / 74  (left arm) Cuff size:   regular  Vitals Entered By: Josph Macho RMA (September 02, 2009 2:46 PM)  O2 Flow:  Room air CC: Possible UTI/ CF Is Patient Diabetic? No   History of Present Illness: Patient in today for recurrent urinary symptoms. The symptoms started acutely this am. She woke up with lower abdominal pressure and has been having episodes of urinary incontinence just today. Felt chilled this am. She denies any dysuria/fevers/hematuria/GI c/o. She reports her last episode of this   Current Medications (verified): 1)  Allegra 180 Mg Tabs (Fexofenadine Hcl) .Marland Kitchen.. 1 Once Daily As Needed 2)  Alendronate Sodium 70 Mg  Tabs (Alendronate Sodium) .... One By Mouth Weekly 3)  Miralax   Powd (Polyethylene Glycol 3350) .... One  Scoop in [employer] Twice Daily As Needed 4)  Tylenol Extra Strength 500 Mg Tabs (Acetaminophen) .... Three Times A Day 5)  Ibuprofen 200 Mg Tabs (Ibuprofen) .... Three Times A Day As Needed 6)  Simvastatin 10 Mg Tabs (Simvastatin) .... Take 1 Tablet By Mouth At Bedtime 7)  Aspirin 81 Mg Tbec (Aspirin) .... Once Daily 8)  Calcium 500 +d 500-400 Mg-Unit Tabs (Calcium-Vitamin D) .... Two Times A Day 9)  Melatonin 3 Mg Tabs (Melatonin) .... Take 1 Tablet By Mouth Once A Day 10)  Multivitamins  Tabs (Multiple Vitamin) .... Once Daily 11)  Fish Oil 300 Mg Caps (Omega-3 Fatty Acids) .... Takes 1000iu Once Daily 12)  Vitamin D3 1000 Unit Caps (Cholecalciferol) .... Once Daily 13)  Vitamin C 500 Mg Tabs (Ascorbic Acid) .... Once Daily 14)  Amlodipine Besylate 2.5 Mg Tabs (Amlodipine Besylate) .... One Daily 15)  Lorazepam 0.5 Mg Tabs (Lorazepam) .... One Twice Daily As Needed For  Anxiety 16)  Synthroid 100 Mcg Tabs (Levothyroxine Sodium) .Marland Kitchen.. 1 Once Daily 17)  Nexium 40 Mg Cpdr (Esomeprazole Magnesium) .... One Daily  As Needed 18)  Sulfacetamide Sodium 10 % Soln (Sulfacetamide Sodium) .... Two Drops in The Affected Eye 4 Times Daily 19)  Ditropan Xl 10 Mg Xr24h-Tab (Oxybutynin Chloride) .... Qam 20)  Cipro 500 Mg Tabs (Ciprofloxacin Hcl) .Marland Kitchen.. 1 By Mouth Two Times A Day For 7 Days  Allergies (verified): 1)  ! Codeine 2)  ! * Plastic Adhesive   Impression & Recommendations:  Problem # 1:  UNSPECIFIED URINARY INCONTINENCE (ICD-788.30)  Orders: Gynecologic Referral (Gyn) T-Urine Culture ([facility]) [phone]) Recurrent and notable, will treat with Ciprofloxacin and refer to GYN for evaluation of possible  bladder prolapse  Problem # 2:  HYPERTENSION (ICD-401.9)  Her updated medication list for this problem includes:    Amlodipine Besylate 2.5 Mg Tabs (Amlodipine besylate) ..... One daily Mild elevation with acute illness, cont current meds  Complete Medication List: 1)  Allegra 180 Mg Tabs (Fexofenadine hcl) .Marland Kitchen.. 1 once daily as needed 2)  Alendronate Sodium 70 Mg Tabs (Alendronate sodium) .... One by mouth weekly 3)  Miralax Powd (Polyethylene glycol 3350) .... One  scoop in 12oz water twice daily as needed 4)  Tylenol Extra  Strength 500 Mg Tabs (Acetaminophen) .... Three times a day 5)  Ibuprofen 200 Mg Tabs (Ibuprofen) .... Three times a day as needed 6)  Simvastatin 10 Mg Tabs (Simvastatin) .... Take 1 tablet by mouth at bedtime 7)  Aspirin 81 Mg Tbec (Aspirin) .... Once daily 8)  Calcium 500 +d 500-400 Mg-unit Tabs (Calcium-vitamin d) .... Two times a day 9)  Melatonin 3 Mg Tabs (Melatonin) .... Take 1 tablet by mouth once a day 10)  Multivitamins Tabs (Multiple vitamin) .... Once daily 11)  Fish Oil 300 Mg Caps (Omega-3 fatty acids) .... Takes 1000iu once daily 12)  Vitamin D3 1000 Unit Caps (Cholecalciferol) .... Once daily 13)  Vitamin C  500 Mg Tabs (Ascorbic acid) .... Once daily 14)  Amlodipine Besylate 2.5 Mg Tabs (Amlodipine besylate) .... One daily 15)  Lorazepam 0.5 Mg Tabs (Lorazepam) .... One twice daily as needed for anxiety 16)  Synthroid 100 Mcg Tabs (Levothyroxine sodium) .Marland Kitchen.. 1 once daily 17)  Nexium 40 Mg Cpdr (Esomeprazole magnesium) .... One daily  as needed 18)  Sulfacetamide Sodium 10 % Soln (Sulfacetamide sodium) .... Two drops in the affected eye 4 times daily 19)  Ditropan Xl 10 Mg Xr24h-tab (Oxybutynin chloride) .... Qam 20)  Cipro 500 Mg Tabs (Ciprofloxacin hcl) .Marland Kitchen.. 1 by mouth two times a day for 7 days 21)  Cipro 250 Mg Tabs (Ciprofloxacin hcl) .Marland Kitchen.. 1 tab by mouth two times a day x 3 days  Patient Instructions: 1)  Please schedule a follow-up appointment as needed if symptoms worsen or do not improve. 2)  Drink plenty of fluids up to 3-4 quarts a day. Cranberry juice is especially recommended in addition to large amounts of water. Avoid caffeine & carbonated drinks, they tend to irritate the bladder, Return in 3-5 days if you're not better: sooner if you're feeling worse.  3)  Take your antibiotic as prescribed until ALL of it is gone, but stop if you develop a rash or swelling and contact our office as soon as possible.  Prescriptions: CIPRO 250 MG TABS (CIPROFLOXACIN HCL) 1 tab by mouth two times a day x 3 days  #6 x 0   Entered and Authorized by:   Danise Edge MD   Signed by:   Danise Edge MD on 09/02/2009   Method used:   Electronically to        [facility]  [phone]* (retail)       [address]       [place], [place]  [address]       Ph: [phone] or [phone]       Fax: [phone]   RxID:   [id number]   Laboratory Results   Urine Tests    Routine Urinalysis   Color: yellow Appearance: Clear Glucose: negative   (Normal Range: Negative) Bilirubin: negative   (Normal Range: Negative) Ketone: negative   (Normal Range: Negative) Spec.  Gravity: 1.020   (Normal Range: 1.003-1.035) Blood: trace-lysed   (Normal Range: Negative) pH: 5.0   (Normal Range: 5.0-8.0) Protein: trace   (Normal Range: Negative) Urobilinogen: 0.2   (Normal Range: 0-1) Nitrite: negative   (Normal Range: Negative) Leukocyte Esterace: negative   (Normal Range: Negative)

## 2010-02-16 NOTE — Assessment & Plan Note (Signed)
Summary: 6 month rov/njr   Vital Signs:  Patient profile:   77 year old female Weight:      126 pounds Temp:     97.7 degrees F oral BP sitting:   112 / 76  (left arm) Cuff size:   regular  Vitals Entered By: Duard Brady LPN (Jun 08, 2009 8:28 AM) And aCC: rov - doing well , check (R) eye ?swelling   CC:  rov - doing well  and check (R) eye ?swelling.  History of Present Illness: 77 year old patient who is seen today for follow-up.  She has a history of hypertension and gastroesophageal reflux disease.  She has done quite well.  She noticed today some irritation and redness along her right eye.  Her reflux symptoms have been stable on Nexium.  She has hypertension that has been well-controlled.  She has treated dyslipidemia, and a history of hypothyroidism.  No other concerns or complaints.  She has osteoporosis treated with alendronate. She has a history of overactive bladder and is complaining of significant urgency and is requesting treatment  Allergies: 1)  ! Codeine 2)  ! * Plastic Adhesive  Past History:  Past Medical History: Reviewed history from 12/09/2008 and no changes required. Hypothyroidism Osteoporosis DJD Mild Hypercholesterolemia Menopausal syndrome Osteoarthritis Diverticulosis, colon peripheral vascular occlusive diseaseand glaucoma chronic microvascular changes of the brain history of polysubstance abuse Hyperlipidemia Overactive Bladder partial deafness Hypertension  Past Surgical History: Reviewed history from 11/19/2007 and no changes required. Hysterectomy-1982 Tonsillectomy Rt knee arthroscopic sx-2004 G 1 P 1 Lt corneal implant Colonoscopy- 11/15/2001 multiple corneal transplants left eye right totally replacement surgery, November 2007  Family History: Reviewed history from 11/16/2006 and no changes required. Fam hx COPD Fam hx MI Fam hx Brain stem stroke  father died age 57, MI mother died age 49 complications of COPD,  history of osteoporosis  One brother died age 69 brainstem stroke one sister is 41 in good health  Social History: Reviewed history from 11/19/2007 and no changes required. Former Smoker Married  Review of Systems  The patient denies anorexia, fever, weight loss, weight gain, vision loss, decreased hearing, hoarseness, chest pain, syncope, dyspnea on exertion, peripheral edema, prolonged cough, headaches, hemoptysis, abdominal pain, melena, hematochezia, severe indigestion/heartburn, hematuria, incontinence, genital sores, muscle weakness, suspicious skin lesions, transient blindness, difficulty walking, depression, unusual weight change, abnormal bleeding, enlarged lymph nodes, angioedema, and breast masses.    Physical Exam  General:  Well-developed,well-nourished,in no acute distress; alert,appropriate and cooperative throughout examination Head:  Normocephalic and atraumatic without obvious abnormalities. No apparent alopecia or balding. Eyes:  right conjunctiva injection Mouth:  Oral mucosa and oropharynx without lesions or exudates.  Teeth in good repair. Neck:  No deformities, masses, or tenderness noted. Lungs:  Normal respiratory effort, chest expands symmetrically. Lungs are clear to auscultation, no crackles or wheezes. Heart:  Normal rate and regular rhythm. S1 and S2 normal without gallop, murmur, click, rub or other extra sounds. Abdomen:  Bowel sounds positive,abdomen soft and non-tender without masses, organomegaly or hernias noted. Msk:  No deformity or scoliosis noted of thoracic or lumbar spine.   Pulses:  R and L carotid,radial,femoral,dorsalis pedis and posterior tibial pulses are full and equal bilaterally Extremities:  No clubbing, cyanosis, edema, or deformity noted with normal full range of motion of all joints.     Impression & Recommendations:  Problem # 1:  HYPERTENSION (ICD-401.9)  Her updated medication list for this problem includes:    Amlodipine  Besylate 2.5 Mg  Tabs (Amlodipine besylate) ..... One daily  Problem # 2:  HYPERLIPIDEMIA (ICD-272.4)  Her updated medication list for this problem includes:    Simvastatin 10 Mg Tabs (Simvastatin) .Marland Kitchen... Take 1 tablet by mouth at bedtime  Problem # 3:  GERD (ICD-530.81)  Her updated medication list for this problem includes:    Nexium 40 Mg Cpdr (Esomeprazole magnesium) ..... One daily  as needed  Problem # 4:  CONJUNCTIVITIS, ACUTE (ICD-372.00)  Her updated medication list for this problem includes:    Sulfacetamide Sodium 10 % Soln (Sulfacetamide sodium) .Marland Kitchen..Marland Kitchen Two drops in the affected eye 4 times daily  Orders: Prescription Created Electronically [phone])  Problem # 5:  OVERACTIVE BLADDER (ICD-596.51)  will provide samples of VESIcare 5 mg  Orders: Prescription Created Electronically [phone])  Complete Medication List: 1)  Allegra 180 Mg Tabs (Fexofenadine hcl) .Marland Kitchen.. 1 once daily as needed 2)  Alendronate Sodium 70 Mg Tabs (Alendronate sodium) .... One by mouth weekly 3)  Miralax Powd (Polyethylene glycol 3350) .... One  scoop in 12oz water twice daily as needed 4)  Tylenol Extra Strength 500 Mg Tabs (Acetaminophen) .... Three times a day 5)  Ibuprofen 200 Mg Tabs (Ibuprofen) .... Three times a day as needed 6)  Simvastatin 10 Mg Tabs (Simvastatin) .... Take 1 tablet by mouth at bedtime 7)  Aspirin 81 Mg Tbec (Aspirin) .... Once daily 8)  Calcium 500 +d 500-400 Mg-unit Tabs (Calcium-vitamin d) .... Two times a day 9)  Melatonin 3 Mg Tabs (Melatonin) .... Take 1 tablet by mouth once a day 10)  Multivitamins Tabs (Multiple vitamin) .... Once daily 11)  Fish Oil 300 Mg Caps (Omega-3 fatty acids) .... Takes 1000iu once daily 12)  Vitamin D3 1000 Unit Caps (Cholecalciferol) .... Once daily 13)  Vitamin C 500 Mg Tabs (Ascorbic acid) .... Once daily 14)  Amlodipine Besylate 2.5 Mg Tabs (Amlodipine besylate) .... One daily 15)  Lorazepam 0.5 Mg Tabs (Lorazepam) .... One twice daily  as needed for anxiety 16)  Synthroid 100 Mcg Tabs (Levothyroxine sodium) .Marland Kitchen.. 1 once daily 17)  Nexium 40 Mg Cpdr (Esomeprazole magnesium) .... One daily  as needed 18)  Sulfacetamide Sodium 10 % Soln (Sulfacetamide sodium) .... Two drops in the affected eye 4 times daily  Patient Instructions: 1)  Please schedule a follow-up appointment in 6 months. 2)  Advised not to eat any food or drink any liquids after 10 PM the night before your procedure. 3)  It is important that you exercise regularly at least 20 minutes 5 times a week. If you develop chest pain, have severe difficulty breathing, or feel very tired , stop exercising immediately and seek medical attention. Prescriptions: SULFACETAMIDE SODIUM 10 % SOLN (SULFACETAMIDE SODIUM) two drops in the affected eye 4 times daily  #10 cc x 0   Entered and Authorized by:   Gordy Savers  MD   Signed by:   Gordy Savers  MD on 06/08/2009   Method used:   Print then Give to Patient   RxID:   [id number] LORAZEPAM 0.5 MG TABS (LORAZEPAM) one twice daily as needed for anxiety  #50 x 2   Entered and Authorized by:   Gordy Savers  MD   Signed by:   Gordy Savers  MD on 06/08/2009   Method used:   Print then Give to Patient   RxID:   [id number] NEXIUM 40 MG CPDR (ESOMEPRAZOLE MAGNESIUM) one daily  as needed  #90 x 6  Entered and Authorized by:   Gordy Savers  MD   Signed by:   Gordy Savers  MD on 06/08/2009   Method used:   Electronically to        [facility]* (mail-order)             ,          Ph: [phone]       Fax: [phone]   RxID:   [id number] SYNTHROID 100 MCG TABS (LEVOTHYROXINE SODIUM) 1 once daily  #90 x 3   Entered and Authorized by:   Gordy Savers  MD   Signed by:   Gordy Savers  MD on 06/08/2009   Method used:   Electronically to        [facility]* (mail-order)             ,          Ph: [phone]       Fax: [phone]   RxID:    [id number] AMLODIPINE BESYLATE 2.5 MG TABS (AMLODIPINE BESYLATE) one daily  #90 x 6   Entered and Authorized by:   Gordy Savers  MD   Signed by:   Gordy Savers  MD on 06/08/2009   Method used:   Electronically to        [facility]* (mail-order)             ,          Ph: [phone]       Fax: [phone]   RxID:   [id number] SIMVASTATIN 10 MG TABS (SIMVASTATIN) Take 1 tablet by mouth at bedtime  #90 x 3   Entered and Authorized by:   Gordy Savers  MD   Signed by:   Gordy Savers  MD on 06/08/2009   Method used:   Electronically to        [facility]* (mail-order)             ,          Ph: [phone]       Fax: [phone]   RxID:   [id number] ALENDRONATE SODIUM 70 MG  TABS (ALENDRONATE SODIUM) one by mouth weekly  #12 Each x 5   Entered and Authorized by:   Gordy Savers  MD   Signed by:   Gordy Savers  MD on 06/08/2009   Method used:   Electronically to        [facility]* (mail-order)             ,          Ph: [phone]       Fax: [phone]   RxID:   [id number] ALLEGRA 180 MG TABS (FEXOFENADINE HCL) 1 once daily as needed  #90 x 6   Entered and Authorized by:   Gordy Savers  MD   Signed by:   Gordy Savers  MD on 06/08/2009   Method used:   Electronically to        [facility]* (mail-order)             ,          Ph: [phone]       Fax: [phone]   RxID:   [id number]

## 2010-02-16 NOTE — Progress Notes (Signed)
Summary: simvastatin - refill at [facility]  Phone Note Refill Request Message from:  Fax from Pharmacy on September 15, 2009 1:14 PM  Refills Requested: Medication #1:  SIMVASTATIN 10 MG TABS Take 1 tablet by mouth at bedtime   Last Refilled: 08/14/2009 [facility]       Method Requested: Electronic Initial call taken by: Duard Brady LPN,  September 15, 2009 1:15 PM  Follow-up for Phone Call        spoke with pt - would prefer this rx filled at [facility] Follow-up by: Duard Brady LPN,  September 15, 2009 1:24 PM    Prescriptions: SIMVASTATIN 10 MG TABS (SIMVASTATIN) Take 1 tablet by mouth at bedtime  #90 x 3   Entered by:   Duard Brady LPN   Authorized by:   Gordy Savers  MD   Signed by:   Duard Brady LPN on 62/13/0865   Method used:   Electronically to        [facility]  [phone]* (retail)       [address]       [place], [place]  [address]       Ph: [phone] or [phone]       Fax: [phone]   RxID:   [id number]

## 2010-02-17 ENCOUNTER — Telehealth: Payer: Self-pay | Admitting: Internal Medicine

## 2010-02-17 DIAGNOSIS — N390 Urinary tract infection, site not specified: Secondary | ICD-10-CM

## 2010-02-17 MED ORDER — CIPROFLOXACIN HCL 500 MG PO TABS: 500.0000 mg | ORAL_TABLET | Freq: Two times a day (BID) | ORAL | Status: AC

## 2010-02-17 NOTE — Telephone Encounter (Signed)
Med called to [facility]. Change to med list. Called pt.

## 2010-02-17 NOTE — Telephone Encounter (Signed)
Please call in a  prescription for generic Cipro 500  mg b.i.d.number 14  (Please check her allergy list)

## 2010-02-17 NOTE — Telephone Encounter (Signed)
Pt would like abx call into [facility] [phone] for uti. Please call pt son Renae Fickle at [phone]

## 2010-02-18 NOTE — Progress Notes (Signed)
Summary: refill lorazopam  Phone Note Refill Request   lorazopam 0.5mg  two times a day as needed   this was removed from med list - pelase advise   Method Requested: Fax to Local Pharmacy Initial call taken by: Duard Brady LPN,  December 30, 2009 11:11 AM  Follow-up for Phone Call        #50  Follow-up by: Gordy Savers  MD,  December 30, 2009 11:21 AM    New/Updated Medications: LORAZEPAM 0.5 MG TABS (LORAZEPAM) 1 by mouth two times a day as needed anxiety Prescriptions: LORAZEPAM 0.5 MG TABS (LORAZEPAM) 1 by mouth two times a day as needed anxiety  #50 x 0   Entered by:   Duard Brady LPN   Authorized by:   Gordy Savers  MD   Signed by:   Duard Brady LPN on 16/10/9602   Method used:   Historical   RxID:   [id number]

## 2010-02-18 NOTE — Progress Notes (Signed)
Summary: Pt out of Zolpidem. Req refill to be called in today asap.  Phone Note Call from Patient Call back at [facility] Phone [phone]   Caller: Patient Summary of Call: Pt called and said that she is completely out of Zolpidem and would like to pick up refill at [facility] today.  Initial call taken by: Lucy Antigua,  February 04, 2010 10:28 AM  Follow-up for Phone Call        erequest rec'vd - Follow-up by: Duard Brady LPN,  February 04, 2010 10:50 AM

## 2010-02-24 NOTE — Progress Notes (Signed)
Summary: request for rx / order for home health    Caller: Daughter Call For: Claire Savers  MD Summary of Call: Pt's daughter wants to talk to Dr. Kirtland Bouchard re: her Mom. She has dementia, and (daughter) wants to talk to Dr. Kirtland Bouchard about her anger issues.  Her father is in the hospital with cancer and is afraid to come home.  Any meds for pt that will help her from being hostile.  Mr. Frame has bladder cancer, and can no longer take care of pt. We have no  release to speak to the family.  Where to go from here?  Assisted living? Pt is coming home tomorrow. [phone] Prefers caretakers, and some kind of meds. Initial call taken by: Debby Meadows CMA AAMA,  February 16, 2010 9:47 AM    New/Updated Medications: SERTRALINE HCL 25 MG TABS (SERTRALINE HCL) one in the am q day Prescriptions: SERTRALINE HCL 25 MG TABS (SERTRALINE HCL) one in the am q day  #30 x 0   Entered by:   Lynann Beaver CMA AAMA   Authorized by:   Claire Savers  MD   Signed by:   Lynann Beaver CMA AAMA on 02/16/2010   Method used:   Electronically to        [facility]  [phone]* (retail)       [address]       [place], [place]  [address]       Ph: [phone] or [phone]       Fax: [phone]   RxID:   [id number]  sertraline 25  #30 one daily in am;  please call [facility]; also, Home health Nursing due to dementia;  ROV one month  Meds sent in........Marland KitchenNeeds home health referral. Lynann Beaver CMA AAMA  February 16, 2010 4:54 PM Daughter called from the pharmacy asking it to be called ASAP. Lynann Beaver CMA AAMA  February 16, 2010 4:56 PM

## 2010-03-04 ENCOUNTER — Encounter: Payer: Self-pay | Admitting: Internal Medicine

## 2010-03-05 ENCOUNTER — Ambulatory Visit: Payer: Self-pay | Admitting: Internal Medicine

## 2010-03-08 ENCOUNTER — Ambulatory Visit (INDEPENDENT_AMBULATORY_CARE_PROVIDER_SITE_OTHER): Payer: 59 | Admitting: Internal Medicine

## 2010-03-08 ENCOUNTER — Encounter: Payer: Self-pay | Admitting: Internal Medicine

## 2010-03-08 DIAGNOSIS — L03019 Cellulitis of unspecified finger: Secondary | ICD-10-CM

## 2010-03-08 DIAGNOSIS — M1990 Unspecified osteoarthritis, unspecified site: Secondary | ICD-10-CM

## 2010-03-08 DIAGNOSIS — I10 Essential (primary) hypertension: Secondary | ICD-10-CM

## 2010-03-08 MED ORDER — CEPHALEXIN 500 MG PO CAPS: 500.0000 mg | ORAL_CAPSULE | Freq: Three times a day (TID) | ORAL | Status: AC

## 2010-03-08 NOTE — Patient Instructions (Signed)
Soak right hand four times daily  Take your antibiotic as prescribed until ALL of it is gone, but stop if you develop a rash, swelling, or any side effects of the medication.  Contact our office as soon as possible if  there are side effects of the medication.   Call or return to clinic prn if these symptoms worsen or fail to improve as anticipated.

## 2010-03-08 NOTE — Progress Notes (Signed)
  Subjective:    Patient ID: [name], female    DOB: 1930-11-05, 77 y.o.   MRN: [id number]  HPI  77 year old patient who has a history of hypertension, and osteoarthritis, who is seen today for follow-up.  For the past few, days.  She's had pain and swelling involving the lateral aspect of her right third finger.  Her arthritis symptoms have been stable.  There is been no history of trauma to the finger, fever, or other systemic complaints.  Her hypertension has been well controlled off medications recently.Review of Systems  Constitutional: Negative.   HENT: Negative for hearing loss, congestion, sore throat, rhinorrhea, dental problem, sinus pressure and tinnitus.   Eyes: Negative for pain, discharge and visual disturbance.  Respiratory: Negative for cough and shortness of breath.   Cardiovascular: Negative for chest pain, palpitations and leg swelling.  Gastrointestinal: Negative for nausea, vomiting, abdominal pain, diarrhea, constipation, blood in stool and abdominal distention.  Genitourinary: Negative for dysuria, urgency, frequency, hematuria, flank pain, vaginal bleeding, vaginal discharge, difficulty urinating, vaginal pain and pelvic pain.  Musculoskeletal: Negative for joint swelling, arthralgias and gait problem.  Skin: Positive for wound. Negative for rash.  Neurological: Negative for dizziness, syncope, speech difficulty, weakness, numbness and headaches.  Hematological: Negative for adenopathy.  Psychiatric/Behavioral: Negative for behavioral problems, dysphoric mood and agitation. The patient is not nervous/anxious.        Objective:   Physical Exam  Constitutional: She is oriented to person, place, and time. She appears well-developed and well-nourished.  HENT:  Head: Normocephalic.  Right Ear: External ear normal.  Left Ear: External ear normal.  Mouth/Throat: Oropharynx is clear and moist.  Eyes: Conjunctivae and EOM are normal. Pupils are equal, round, and  reactive to light.  Neck: Normal range of motion. Neck supple. No thyromegaly present.  Cardiovascular: Normal rate, regular rhythm, normal heart sounds and intact distal pulses.   Pulmonary/Chest: Effort normal and breath sounds normal.  Abdominal: Soft. Bowel sounds are normal. She exhibits no mass. There is no tenderness.  Musculoskeletal: Normal range of motion.  Lymphadenopathy:    She has no cervical adenopathy.  Neurological: She is alert and oriented to person, place, and time.  Skin: Skin is warm and dry. No rash noted.        paronychia was present involving the lateral aspect of the right third finger  Psychiatric: She has a normal mood and affect. Her behavior is normal.          Assessment & Plan:  Paronychia left third finger-  The patient has been asked to smoke 3 or 4 times daily and to apply gentle massage to gently express any Exudate.  She was placed on antibiotic therapy. Hypertension stable Osteoarthritis, stable

## 2010-03-18 ENCOUNTER — Encounter: Payer: Self-pay | Admitting: Internal Medicine

## 2010-03-19 ENCOUNTER — Ambulatory Visit (INDEPENDENT_AMBULATORY_CARE_PROVIDER_SITE_OTHER): Payer: 59 | Admitting: Internal Medicine

## 2010-03-19 ENCOUNTER — Encounter: Payer: Self-pay | Admitting: Internal Medicine

## 2010-03-19 DIAGNOSIS — B373 Candidiasis of vulva and vagina: Secondary | ICD-10-CM

## 2010-03-19 DIAGNOSIS — I10 Essential (primary) hypertension: Secondary | ICD-10-CM

## 2010-03-19 DIAGNOSIS — M1990 Unspecified osteoarthritis, unspecified site: Secondary | ICD-10-CM

## 2010-03-19 MED ORDER — FLUCONAZOLE 150 MG PO TABS: 150.0000 mg | ORAL_TABLET | Freq: Once | ORAL | Status: AC

## 2010-03-19 NOTE — Patient Instructions (Signed)
Limit your sodium (Salt) intake  Take a calcium supplement, plus 800-1200 units of vitamin D  Return in 4 months for follow-up  

## 2010-03-19 NOTE — Progress Notes (Signed)
  Subjective:    Patient ID: [name], female    DOB: May 06, 1930, 77 y.o.   MRN: [id number]  HPI   A 77 year old patient who is in today for followup. She was seen recently and treated with cephalexin for a finger paronychia. For the past several days she has had vaginal itching and discharge. Her finger paronychia has resolved. She has hypertension and osteoarthritis which have been stable no other concerns or complaints. She is accompanied by her husband.    Review of Systems  Constitutional: Negative.   HENT: Negative for hearing loss, congestion, sore throat, rhinorrhea, dental problem, sinus pressure and tinnitus.   Eyes: Negative for pain, discharge and visual disturbance.  Respiratory: Negative for cough and shortness of breath.   Cardiovascular: Negative for chest pain, palpitations and leg swelling.  Gastrointestinal: Negative for nausea, vomiting, abdominal pain, diarrhea, constipation, blood in stool and abdominal distention.  Genitourinary: Positive for vaginal discharge. Negative for dysuria, urgency, frequency, hematuria, flank pain, vaginal bleeding, difficulty urinating, vaginal pain and pelvic pain.  Musculoskeletal: Negative for joint swelling, arthralgias and gait problem.  Skin: Negative for rash.  Neurological: Negative for dizziness, syncope, speech difficulty, weakness, numbness and headaches.  Hematological: Negative for adenopathy.  Psychiatric/Behavioral: Negative for behavioral problems, dysphoric mood and agitation. The patient is not nervous/anxious.        Objective:   Physical Exam  Constitutional: She appears well-developed and well-nourished. No distress.  Skin: Skin is warm and dry.        The paronychia involving her right third finger has resolved          Assessment & Plan:   antibiotic associated monilial vaginitis  Hypertension stable   We'll treat with Diflucan

## 2010-03-30 ENCOUNTER — Ambulatory Visit (INDEPENDENT_AMBULATORY_CARE_PROVIDER_SITE_OTHER): Payer: 59 | Admitting: Internal Medicine

## 2010-03-30 ENCOUNTER — Encounter: Payer: Self-pay | Admitting: Internal Medicine

## 2010-03-30 DIAGNOSIS — I10 Essential (primary) hypertension: Secondary | ICD-10-CM

## 2010-03-30 DIAGNOSIS — N318 Other neuromuscular dysfunction of bladder: Secondary | ICD-10-CM

## 2010-03-30 DIAGNOSIS — E039 Hypothyroidism, unspecified: Secondary | ICD-10-CM

## 2010-03-30 MED ORDER — SIMVASTATIN 10 MG PO TABS: 10.0000 mg | ORAL_TABLET | Freq: Every evening | ORAL | Status: DC

## 2010-03-30 MED ORDER — DONEPEZIL HCL 10 MG PO TABS: 10.0000 mg | ORAL_TABLET | Freq: Every day | ORAL | Status: DC

## 2010-03-30 MED ORDER — ZOLPIDEM TARTRATE 5 MG PO TABS: 5.0000 mg | ORAL_TABLET | Freq: Every evening | ORAL | Status: DC | PRN

## 2010-03-30 NOTE — Patient Instructions (Signed)
Limit your sodium (Salt) intake    It is important that you exercise regularly, at least 20 minutes 3 to 4 times per week.  If you develop chest pain or shortness of breath seek  medical attention.  Take a calcium supplement, plus 800-1200 units of vitamin D  Return in 4 months for follow-up   

## 2010-03-30 NOTE — Progress Notes (Signed)
  Subjective:    Patient ID: [name], female    DOB: 04-Nov-1930, 77 y.o.   MRN: [id number]  HPI   77 year old patient who is in today for followup of hypertension dyslipidemia osteoarthritis. She is requesting a refill on Ambien 5 mg. She seems to do fairly well. She was seen her in a month and thought to be stable. Unclear what prompted the visit today. She is accompanied by her husband who dispenses her medications. She seems to be stable.    Review of Systems  Constitutional: Negative.   HENT: Negative for hearing loss, congestion, sore throat, rhinorrhea, dental problem, sinus pressure and tinnitus.   Eyes: Negative for pain, discharge and visual disturbance.  Respiratory: Negative for cough and shortness of breath.   Cardiovascular: Negative for chest pain, palpitations and leg swelling.  Gastrointestinal: Negative for nausea, vomiting, abdominal pain, diarrhea, constipation, blood in stool and abdominal distention.  Genitourinary: Negative for dysuria, urgency, frequency, hematuria, flank pain, vaginal bleeding, vaginal discharge, difficulty urinating, vaginal pain and pelvic pain.  Musculoskeletal: Negative for joint swelling, arthralgias and gait problem.  Skin: Negative for rash.  Neurological: Negative for dizziness, syncope, speech difficulty, weakness, numbness and headaches.  Hematological: Negative for adenopathy.  Psychiatric/Behavioral: Negative for behavioral problems, dysphoric mood and agitation. The patient is not nervous/anxious.        Objective:   Physical Exam  Constitutional: She is oriented to person, place, and time. She appears well-developed and well-nourished.  HENT:  Head: Normocephalic.  Right Ear: External ear normal.  Left Ear: External ear normal.  Mouth/Throat: Oropharynx is clear and moist.  Eyes: Conjunctivae and EOM are normal. Pupils are equal, round, and reactive to light.  Neck: Normal range of motion. Neck supple. No thyromegaly  present.  Cardiovascular: Normal rate, regular rhythm, normal heart sounds and intact distal pulses.   Pulmonary/Chest: Effort normal and breath sounds normal.  Abdominal: Soft. Bowel sounds are normal. She exhibits no mass. There is no tenderness.  Musculoskeletal: Normal range of motion.  Lymphadenopathy:    She has no cervical adenopathy.  Neurological: She is alert and oriented to person, place, and time.  Skin: Skin is warm and dry. No rash noted.  Psychiatric: She has a normal mood and affect. Her behavior is normal.        Moderate cognitive impairment          Assessment & Plan:   hypertension stable   Dyslipidemia stable Hypothyroidism-  Well-controlled

## 2010-04-25 LAB — DIFFERENTIAL
Basophils Absolute: 0 10*3/uL (ref 0.0–0.1)
Basophils Relative: 0 % (ref 0–1)
Eosinophils Absolute: 0.1 10*3/uL (ref 0.0–0.7)
Eosinophils Relative: 1 % (ref 0–5)
Monocytes Absolute: 0.5 10*3/uL (ref 0.1–1.0)
Monocytes Relative: 6 % (ref 3–12)
Neutro Abs: 6.5 10*3/uL (ref 1.7–7.7)

## 2010-04-25 LAB — COMPREHENSIVE METABOLIC PANEL
ALT: 17 U/L (ref 0–35)
Albumin: 3.3 g/dL — ABNORMAL LOW (ref 3.5–5.2)
Alkaline Phosphatase: 88 U/L (ref 39–117)
BUN: 7 mg/dL (ref 6–23)
CO2: 28 mEq/L (ref 19–32)
Calcium: 9.7 mg/dL (ref 8.4–10.5)
Chloride: 106 mEq/L (ref 96–112)
Creatinine, Ser: 0.68 mg/dL (ref 0.4–1.2)
GFR calc Af Amer: >60 mL/min (ref >60)
GFR calc non Af Amer: >60 mL/min (ref >60)
Glucose, Bld: 112 mg/dL — ABNORMAL HIGH (ref 70–99)
Potassium: 3.5 mEq/L (ref 3.5–5.1)
Sodium: 141 mEq/L (ref 135–145)
Total Bilirubin: 0.8 mg/dL (ref 0.3–1.2)

## 2010-04-25 LAB — CBC
HCT: 31.8 % — ABNORMAL LOW (ref 36.0–46.0)
HCT: 37.1 % (ref 36.0–46.0)
Hemoglobin: 10.9 g/dL — ABNORMAL LOW (ref 12.0–15.0)
MCHC: 33.5 g/dL (ref 30.0–36.0)
MCHC: 34.3 g/dL (ref 30.0–36.0)
MCV: 88.6 fL (ref 78.0–100.0)
Platelets: 333 10*3/uL (ref 150–400)
RBC: 3.61 MIL/uL — ABNORMAL LOW (ref 3.87–5.11)
RDW: 12.6 % (ref 11.5–15.5)
WBC: 8.9 10*3/uL (ref 4.0–10.5)

## 2010-04-25 LAB — URINALYSIS, ROUTINE W REFLEX MICROSCOPIC
Glucose, UA: NEGATIVE mg/dL
Protein, ur: NEGATIVE mg/dL
Specific Gravity, Urine: 1.006 (ref 1.005–1.030)

## 2010-04-25 LAB — CK TOTAL AND CKMB (NOT AT ARMC)
Relative Index: INVALID (ref 0.0–2.5)
Total CK: 81 U/L (ref 7–177)

## 2010-04-25 LAB — LIPID PANEL
Cholesterol: 149 mg/dL (ref 0–200)
HDL: 43 mg/dL (ref >39)
LDL Cholesterol: 91 mg/dL (ref 0–99)
Total CHOL/HDL Ratio: 3.5 RATIO
Triglycerides: 74 mg/dL (ref <150)

## 2010-04-25 LAB — CARDIAC PANEL(CRET KIN+CKTOT+MB+TROPI)
CK, MB: 1.3 ng/mL (ref 0.3–4.0)
Total CK: 74 U/L (ref 7–177)

## 2010-04-25 LAB — HEMOGLOBIN A1C: Hgb A1c MFr Bld: 5.8 % (ref 4.6–6.1)

## 2010-04-25 LAB — URINE CULTURE: Colony Count: NO GROWTH

## 2010-04-25 LAB — TROPONIN I: Troponin I: 0.01 ng/mL (ref 0.00–0.06)

## 2010-05-11 ENCOUNTER — Ambulatory Visit: Payer: 59 | Admitting: Internal Medicine

## 2010-06-01 NOTE — Op Note (Signed)
[name], [name] NO.:  [id number]   MEDICAL RECORD NO.:  [id number]          PATIENT TYPE:  AMB   LOCATION:  SDS                          FACILITY:  [facility]   PHYSICIAN:  Herby Abraham., M.D.DATE OF BIRTH:  Feb 21, 1930   DATE OF PROCEDURE:  06/15/2006  DATE OF DISCHARGE:  06/15/2006                               OPERATIVE REPORT   JUSTIFICATIONS AND INDICATIONS FOR SURGERY:  The patient is a 76-year-  old lady with advanced open angle glaucoma in her left eye.  She has had  a previous trabeculectomy which functioned for 6-8 years but  subsequently has failed.  In the meantime she has had cataract surgery  and two corneal transplants.  Now despite topical medical therapy,  intraocular pressure is uncontrolled and a Baerveldt glaucoma implant  with Tutoplast graft has been recommended.  The risks, benefits and  alternative therapies were discussed with the patient.  She decided to  proceed with this.   JUSTIFICATION FOR OUTPATIENT SURGERY INPATIENT:  Not required.   JUSTIFICATION FOR OVERNIGHT STAY:  Not needed.   PREOPERATIVE DIAGNOSIS:  Uncontrolled open angle glaucoma, left eye.   POSTOPERATIVE DIAGNOSIS:  Uncontrolled open angle glaucoma, left eye.   PROCEDURE PERFORMED:  Baerveldt glaucoma implant with Tutoplast tissue  graft, left eye.   SURGEON:  Melvenia Needles, M.D.   ASSISTANT SURGEON:  Nurse.   ANESTHESIA:  General anesthesia.   DESCRIPTION OF PROCEDURE:  The patient was brought to operating room #3  where she was carefully positioned on the operating room table.  General  anesthesia was induced and an orotracheal tube was placed.  The table  was then turned and the patient was positioned at the microscope.  The  eye was prepped but not draped.  The implant was prepared by initially  irrigating through the implant demonstrating its patency, and then a 4-0  nylon suture was placed at the terminal end of the tube from the plate  side and  then two Vicryl sutures were used to crush the tube over this 4-  0 nylon suture.  The tube was then re irrigated and found to be  occluded.  The implant was then placed back onto the [facility].   The eye was then draped as a sterile field.  The lid speculum was  inserted.  A corneal light shield was placed on the cornea and kept  moist throughout the case.  The eye was turned down and a 6-0 silk  suture was placed through the peripheral cornea peripheral to the  corneal transplant and erupted sutures.  This was then clamped to the  drape as a retraction suture.  An incision was made through conjunctiva  and Tenon's capsule 6 mm posterior to the limbus.  This was extended to  the 12 o'clock and 2:30 positions.  Dissection was carried out posterior  to this incision and both the superior and lateral rectus muscles were  isolated on the muscle hook.  The implant was followed out to the  surgical field and was placed beneath the superior rectus muscle which  was isolated on  the muscle hook.  This was passed far beneath the muscle  to allow the temporal wing to be placed beneath the lateral rectus  muscle which was isolated on a muscle hook.  The implant was then fixed  to the drape with two 8-0 nylon sutures, 12 mm posterior to the limbus.  The knots were turned into the eyelids.  Then from the original incision  dissection was carried forward to the limbus.  Hemostasis was obtained  with bipolar cautery.  A paracentesis tract was made with a 15-degree  SuperSharp blade at the 3 o'clock position.  A 22-gauge needle on  viscoelastic was used to create a track through the sclerae into the  posterior chamber.  The initial pass was into the iris.  The needle was  retracted.  The hole was repaired with a single Vicryl suture and a  second needle track was placed, this time successfully, into the  posterior chamber where some viscoelastic was injected.  The tube was  then trimmed and placed through the  track into the eye.  This was  removed to cut it to length with the bevel posteriorly.  It was placed  back into the position with the tube being visible in the pupil.  An 8-0  nylon suture was used to fix the tube to the sclerae without crushing  it.  A 15-degree blade was used to create a 1 mm slit in the tube.   A double thickness Tutoplast graft was placed over the tube, covering it  from it just in front of its entry into the eye and reaching back to the  plate.  This was fixed to the sclera with four 7-0 Vicryl sutures.  The  conjunctival Tenon's flap was then repaired with a running 7-0 Vicryl  suture which was locked every other pass of the needle and tied at both  ends.  The retraction suture was cut and removed.  The anterior chamber  was then deepened by injecting balanced salt solution through the  paracentesis tract.  During the final stages of the procedure, two drops  of scopolamine and two drops of Ocuflox were applied to the eye every 5  minutes for a total of five applications.  At the conclusion of the  procedure, the lid speculum was removed.  The surgical drape was  removed.  The eye was dressed with a light eye pad and an eye shield.  The patient was transported to the recovery area in good condition.  She  is to be followed up in my office on Jun 16, 2006.           ______________________________  Herby Abraham., M.D.     LC/MEDQ  D:  06/15/2006  T:  06/15/2006  Job:  [id number]

## 2010-06-01 NOTE — H&P (Signed)
NAME:  [name], [name] NO.:  [id number]   MEDICAL RECORD NO.:  [id number]          PATIENT TYPE:  EMS   LOCATION:  MAJO                         FACILITY:  [facility]   PHYSICIAN:  Michiel Cowboy, MDDATE OF BIRTH:  [birth year]   DATE OF ADMISSION:  08/12/2008  DATE OF DISCHARGE:                              HISTORY & PHYSICAL   PRIMARY CARE PHYSICIAN:  Gordy Savers, M.D.   CHIEF COMPLAINT:  Left leg swelling and pain as well as symptoms of  dementia.   About a week ago, the patient had a trauma to her left foot with the  heavy side of an object falling on top of that.  She has followed by  urgent care for this and started on Keflex on the 24th.  Her foot  continued to get red and swollen, at which point she was brought into  the emergency department.  Of note, also family has been reporting to  the emergency physician that for over a year, she has become  progressively more confused and has had changes in her personality,  memory problems, and this has progressed to a point where they fear for  her safety at home.  She has been angry, and they have hard time  controlling her medications.  She possibly is taking medications that  they are unsure where she got them from.  All of this together, and they  would like for her to possibly have placement or at least further  evaluation while in the hospital.   Denies any fever or chills.  No chest pain or shortness of breath.  No  nausea, vomiting, constipation, diarrhea.  Otherwise review of systems  are negative.Marland Kitchen   PAST MEDICAL HISTORY:  1. Significant for glaucoma.  2. Osteoporosis.  3. Hypothyroidism.   SOCIAL HISTORY:  The patient used to smoke but quit about 20 years ago.  Does not drink, does not abuse drugs.  Lives at home with her husband.  Has a very supportive family.   FAMILY HISTORY:  COPD.   ALLERGIES:  CODEINE causes indigestion.   MEDICATIONS:  The family is unsure of which medication  she takes.  Is  fairly certain that she takes Fosamax once a week.  Recently was started  on hydrocodone/acetaminophen as well as Keflex on the 24th.  She takes  Synthroid.  Family was not sure of her doses, but per review of records  in the past, she was taking 125 mcg of this.  Other medications that was  possible was obtained from the pharmacist, where she usually gets her  medication list, includes phentermine, pilocarpine, and PredForte as  well as erythromycin.  It is not certain, actually, if the patient has  been taking any of them and who has prescribed them.  The family will  bring the medications in tomorrow.   PHYSICAL EXAMINATION:  VITALS:  Temperature 98.4, blood pressure 160/78,  pulse 94, respirations 16, satting at 97% on room air.  The patient currently appears to be in no acute distress.  Head nontraumatic.  Moist mucous membranes.  LUNGS:  Clear  to auscultation bilaterally.  HEART: Regular rate and rhythm.  No murmurs appreciated.  ABDOMEN: Soft, nontender, nondistended.  LOWER EXTREMITIES:  The right lower extremity appeared to be normal.  The left lower extremity:  There is a healing laceration site.  The  sutures are intact on the dorsum of the right foot, but there is also  some swelling and redness of the dorsum of the right foot as well and  some warmth.  Pulses are palpable.  NEUROLOGICALLY:  The patient appears to be grossly intact, although not  fully cooperative for exam.  Appears to be pleasantly confused.  Not  oriented to situation.   LABS:  White blood cell count 8.9, hemoglobin 12.4, sodium 133,  potassium 4.1, chloride 0.68, albumin 3.3.  UA negative film of the left  foot showing soft tissue swelling but no osteo.   ASSESSMENT/PLAN:  1. This is a 77 year old female with mild cellulitis of the left foot      and possible dementia.  2. Cellulitis of the left foot.  She did not respond to p.o.      medications.  We will put her on vancomycin for  right now.      Continue to monitor, keep leg elevated.  Again, patient cannot give      me a good history.  Will check Doppler as well to be complete and      to rule out deep venous thrombosis, although I doubt it.  3. Dementia, as etiology unclear at this point.  Will initiate workup.      May need a psychiatric consult possibly.  Will do so.  MRI.  Check      B12, folate, TSH.  4. Elevated blood pressure:  Will continue to monitor.  5. The patient was possibly taking phentermine and other diet      supplements.  We will watch her on telemetry for 24 hours and get 1      set of cardiac markers, given that she has very altered mental      status, and we cannot really get much of a history from her.  6. Hypothyroidism.  Continue Synthroid.  7. Prophylaxis:  Protonix plus Lovenox.  8. Glaucoma:  Will continue Alphagan drops.  Will need to clarify.      Per family, it is supposed to be to both eyes twice a day.      Michiel Cowboy, MD  Electronically Signed     AVD/MEDQ  D:  08/12/2008  T:  08/13/2008  Job:  [id number]   cc:   Gordy Savers, MD

## 2010-06-01 NOTE — Discharge Summary (Signed)
[name], [name]   MEDICAL RECORD NO.:  [id number]          PATIENT TYPE:  INP   LOCATION:  6708                         FACILITY:  [facility]   PHYSICIAN:  Lonia Blood, M.D.       DATE OF BIRTH:  01-13-1931   DATE OF ADMISSION:  08/12/2008  DATE OF DISCHARGE:  08/14/2008                               DISCHARGE SUMMARY   PRIMARY CARE PHYSICIAN:  Gordy Savers, MD   DISCHARGE DIAGNOSES:  1. Right lower extremity cellulitis secondary to trauma to the right      ankle.  2. Right lower extremity edema secondary to the trauma - Dopplers      negative for deep venous thrombosis.  3. Peripheral vascular disease.  4. Remote history of tobacco abuse.  5. Chronic microvascular changes of the brain.  6. Hypothyroidism, currently overly replaced with question of      thyrotoxicosis from medication.  7. Polysubstance abuse including Valium, phentermine, hyoscyamine, and      Darvocet.  8. Altered mental status, most likely due to the polysubstance abuse,      rule out mild cognitive deficit. - patient needs a MMSE 1 month      after discharge  9. Glaucoma.   DISCHARGE MEDICATIONS:  1. Fosamax 70 mg weekly.  2. Prilosec 20 mg daily.  3. Aspirin 81 mg daily.  4. Synthroid 75 mcg daily.  5. Calcium and vitamin D 500 mg with 400 units twice a day.  6. Pilocarpine to the right eye twice a day.  7. Prednisolone to the left eye once a day.  8. Alphagan to the right eye twice a day.  9. Dorzolamide to the right eye twice a day.  10.Simvastatin 10 mg at bedtime.  11.Tylenol 500 mg 3 times a day.  12.Ibuprofen 200 mg 3 times a day as needed for pain.  13.Septra Double Strength 1 tablet twice a day for 1 week.   CONDITION ON DISCHARGE:  [name] is discharged in fair condition.  She  was discharged under the care of her family with home health  arrangements for physical therapy, occupation therapy, registered nurse,  and consult for substance abuse.   The patient will call the office of  Dr. Amador Cunas to get a hospital followup visit within 1 week.  The  patient's husband has been educated about symptoms and signs of  benzodiazepine withdrawal, and he was told to bring the patient to the  emergency room if she has seizures or displays acute agitation and  delirium.  The patient's husband was also offered the option of pursuing  involuntary commitment at this time for [name] but he has chosen not  to.   CONSULTATIONS DURING THIS ADMISSION:  No consultations obtained.   PROCEDURES DURING THIS ADMISSION:  1. The patient underwent a head CT, which was positive for chronic      microvascular changes.  No hydronephrosis and no bleeding.  2. Right lower extremity venous Dopplers, which was negative for deep      venous thrombosis.   HISTORY AND PHYSICAL:  Refer  to dictation H and P done by Dr. Adela Glimpse.   HOSPITAL COURSE:  1. Right lower extremity trauma and cellulitis:  [name] was brought      in by her family for increased redness and warmth of the right      lower extremity.  The patient was status post recent trauma.  She      had treatment with intravenous vancomycin, and she had a quick      improvement of her symptoms.  We switched the patient to oral      Septra, and we suggested 1 week of treatment at home with this      antibiotic.  [name] had right lower extremity venous Dopplers,      which were negative for deep venous thrombosis, and her physical      exam of the right lower extremity did not indicate severe      peripheral arteriovascular disease as confirmed by presence of +2      distal pedis pulse and warm foot.  2. Altered mental status:  On my initial visit with [name] on August 13, 2008, she seemed to be having trouble finding words, difficulty      concentrating, and inability to follow 2 step commands for a      physical exam.  It became apparent that [name] has been doctor      shopping and  getting various prescriptions for narcotics and      benzodiazepines. She was also taking her husbands prescription      diazepam.  I have instructed the patient's husband to search the      house, and he brought in all the bottles that he could find.  These      medications have been taken to the pharmacy and destroyed here at      [facility].  An inventory of the medications included 3      bottles of hyoscyamine, 6 bottles of diazepam, 2 bottles of      phentermine, 4 bottles of propoxyphene, 1 bottle of Librium, 1      bottle of tramadol, 1 bottle of hydrocodone, 1 bottle of lorazepam.      [name] was told to stop taking all these medications, and she      was discharged under the care of her family, which have received      instructions about signs and symptoms of withdrawal and told to      bring her immediately to the emergency room if the patient gets      very agitated or has a seizure.  Upon the time of discharge, Ms.      Patterson was calm and cooperative and she said that she was not doing      this substance abuse on purpose but because she was prescribed all      these medications.  This statement is very misleading since the      patient was abusing Valium that was prescribed for her husband.  In      any case, it is impossible for me to determine exactly how much      benzodiazepine [name] was actually taking over the past months.      I have talked to her husband and expressed my concern for possible      withdrawal but at the same time, I felt that she is unsafe to be  prescribed benzodiazepines at home anymore.  If the patient      develops severe withdrawal, she would be brought back to the      emergency room for an inpatient treatment.  In further evaluating      the patient's mental status, we obtained a head CT since the      patient had a fall, which did not indicate any subdural hemorrhage      but indicated presence of chronic microvascular  changes.  Since Ms.      Patterson has been a long-term tobacco abuser in the past, she was      started on aspirin and statin and she will follow up with her      primary care physician for further treatment.  We also have      performed a TSH, which returned at 0.088.  This indicates over      replacement of the patient's hypothyroidism and we have decreased      the dose of Synthroid to 75 mcg daily.  The patient will need a      repeat TSH in 1 month.     Lonia Blood, M.D.  Electronically Signed    SL/MEDQ  D:  08/14/2008  T:  08/15/2008  Job:  [id number]   cc:   Gordy Savers, MD

## 2010-06-04 NOTE — Discharge Summary (Signed)
[name], [name] NO.:  [id number]   MEDICAL RECORD NO.:  [id number]          PATIENT TYPE:  INP   LOCATION:  5034                         FACILITY:  [facility]   PHYSICIAN:  Elana Alm. Thurston Hole, M.D. DATE OF BIRTH:  06-03-1930   DATE OF ADMISSION:  06/15/2005  DATE OF DISCHARGE:  06/18/2005                                 DISCHARGE SUMMARY   ADMISSION DIAGNOSES:  1.  End-stage degenerative joint disease, right knee.  2.  Glaucoma, left eye.  3.  Hypothyroidism.  4.  Diverticulitis.  5.  Status post corneal transplant.   DISCHARGE DIAGNOSES:  1.  End-stage degenerative joint disease, right knee.  2.  Glaucoma.  3.  History of a corneal transplant.  4.  Hypothyroidism.  5.  Diverticulitis.  6.  Gastroesophageal reflux.   HISTORY OF PRESENT ILLNESS:  The patient is a 77 year old white female with  a history of end-stage DJD of her right knee.  She tried conservative care  including anti-inflammatories and articular cortisone injections and  articular  Supartz injections and debriding arthroscopy, all without  success.  She understands risks, benefits, and possible complications of a  right total knee replacement and is without question.   PROCEDURES:  On Jun 15, 2005, the patient underwent a right total knee  replacement by Dr. Thurston Hole and a right femoral nerve block by anesthesia.  She tolerated both procedures well and was admitted postoperatively for pain  control, DVT prophylaxis, and physical therapy.  Postoperative day #1, she  was alert and oriented x3.  Her pain was 3 out of 10.  She was tolerating  her diet.  No shortness of breath, chest pain, nausea, vomiting.  Vital  signs were stable with a temperature of 99.  Hemoglobin was 9.3.  Her calf  was soft.  Chest was clear.  Regular rate and rhythm of her heart.  Her  abdomen was soft. CPM was 78 degrees.  Capillary refill was brisk.  Sensation was intact in the lower extremity was good EHL and FHL.   Physical  therapy was started.  DVT prophylaxis was done with Lovenox.  Home health  was consulted to arrange for discharge planning for which she will need a  home CPM as well as home health PT, walker, a 3-in-1.  Postoperative day #2,  pain was 2 out of 10. She had a bowel movement.  Her hemoglobin was 8.4.  Her white cell count was 11.  She was alert and oriented x3.  She had a  slight bit of ecchymosis and no drainage.  She has brisk capillary refill.  CPM 0 to 90 degrees.  Her acute blood loss anemia is asymptomatic and will  be followed on a conservative basis.  Doppler to rule out DVT was negative.  Urinalysis was negative.  She was a slight bit tachycardic with a heart rate  of 111. Therefore on postoperative day #3 she received one unit of packed  red blood cells and potassium replacement.  She was discharged to home in  stable condition, weightbearing as tolerated, on a regular diet, with home  health PT, OT.   DISCHARGE MEDICATIONS:  1.  Percocet one q.4-6h. p.r.n. pain.  2.  Robaxin 500 mg q.6h. p.r.n. pain.  3.  Lovenox 30 mg subcu q.12h.  4.  Cosopt q.12h.  5.  Alphagan 0.15% one drop every day.  6.  Prednisolone three times a week.  7.  Synthroid 125 mcg daily.  8.  Celebrex 200 mg daily.  9.  Nexium 40 mg daily.  10. Colace 100 mg twice a day.  11. Senokot-S two tablets before dinner.  12. Iron sulfate 325 mg twice a day with meals.  13. Potassium 40 mg daily x3 days.   She will follow up with Dr. Thurston Hole on June 12th.  She is to use her CPM 0 to  90 degrees eight  hours a day.  She will elevate her right heel on a folded  pillow for 30 minutes daily.  She is on a regular diet, weightbearing as  tolerated.      Kirstin Shepperson, P.A.      Robert A. Thurston Hole, M.D.  Electronically Signed    KS/MEDQ  D:  08/02/2005  T:  08/03/2005  Job:  [id number]

## 2010-06-04 NOTE — Op Note (Signed)
NAMEJAHNAI, [name] NO.:  [id number]   MEDICAL RECORD NO.:  [id number]          PATIENT TYPE:  INP   LOCATION:  5034                         FACILITY:  [facility]   PHYSICIAN:  Elana Alm. Thurston Hole, M.D. DATE OF BIRTH:  1930/06/06   DATE OF PROCEDURE:  06/15/2005  DATE OF DISCHARGE:                                 OPERATIVE REPORT   PREOPERATIVE DIAGNOSIS:  Right knee degenerative joint disease.   POSTOPERATIVE DIAGNOSIS:  Right knee degenerative joint disease.   PROCEDURE:  Right total knee replacement using DePuy cemented total knee  system with #2 cemented femur, #2 cemented tibia with 12.5 mm polyethylene  RP tibial spacer, and the 2 mm polyethylene cemented patella.   SURGEON:  Elana Alm. Thurston Hole, M.D.   ASSISTANT:  Kirstin Shepperson, P.A.-C.   ANESTHESIA:  General.   OPERATIVE TIME:  1 hour 20 minutes.   COMPLICATIONS:  None.   DESCRIPTION OF PROCEDURE:  [name] was brought to operating room on Jun 15, 2005, after a femoral nerve block was placed by anesthesia.  She was  placed on the operating table in supine position.  After being placed under  general anesthesia, the right knee was examined.  She had range of motion  from -3 to 125 degrees with moderate valgus deformity and a stable  ligamentous exam with normal patellar tracking.  She had a Foley catheter  placed under sterile conditions and received Ancef 1 gram IV preoperatively  for prophylaxis.  Her right leg was prepped using sterile DuraPrep and  draped using sterile technique.  The leg was exsanguinated and a thigh  tourniquet elevated to 350 mmHg.  Initially, through a 15 cm longitudinal  incision based over the patella, initial exposure was made.  The underlying  subcutaneous tissues were incised along with the skin incision.  A median  arthrotomy was performed revealing an excessive amount of normal appearing  joint fluid.  The articular surfaces were inspected.  She had grade 4  changes  laterally, grade 3 changes medially, and grade 3 and 4 changes in  the patellofemoral joint.  Osteophyte at the medial and lateral meniscal  remnants were removed as well as the anterior cruciate ligament.  An  intramedullary drill was then drilled up the femoral canal for placement of  the distal femoral cutting jig which was placed in the appropriate manner  rotation and a distal 10 mm cut was made.  The distal femur was incised.  A #2 was found to be the appropriate size.  #2 cutting jig was placed and  then these cuts were made.  The proximal tibia was exposed.  The tibial  spines were removed with an oscillating saw and intramedullary drill was  drilled down the tibial canal for placement of the proximal tibial cutting  jig which was placed in the appropriate amount of rotation and an 8 mm cut  was made based off the medial side.  Spacer blocks were then placed in  flexion and extension.  12.5 mm blocks were placed.  This gave excellent  balancing, excellent stability, and excellent correction  of her flexion and  valgus deformities.  At this point, the #2 tibial base plate trial cuts were  made.  The #2 femoral trial was placed and with the #2 tibial base plate  trial in place and the 12.5 mm polyethylene RP tibial spacer, the knee was  reduced, taken through range of motion from 0 to 125 degrees with excellent  stability and excellent correction of her flexion and valgus deformities.  The patella was incised.  A resurfacing 8 mm cut was made and three locking  holes were placed for the 32 mm patella.  With the patellar trial in place,  patellofemoral tracking was evaluated and this was found to be normal.  At  this point, it was felt that all the components were of excellent size, fit,  and stability.  They were then removed and the knee was then jet lavage  irrigated with 3 liters of saline.  The proximal tibia was then exposed.  #2  tibial baseplate with cement backing was removed  from around the edges.  A  12.5 mm polyethylene RP tibial spacer was placed on tibial baseplate.  The  knee reduced, taken through range of motion from 0 to 125 degrees with  excellent stability and excellent correction of her flexion and valgus  deformities.  The 32 mm polyethylene cement backed patella was then placed  in its position and held there with a clamp.  After the cement hardened,  patellofemoral tracking was again evaluated.  This was found to be normal.  At this point, it was felt that all components were excellent size, fit, and  stability.  The wound was further irrigated with saline.  The tourniquet was  then released.  Hemostasis was obtained with cautery.  The arthrotomy was  then closed with #1 Ethilon suture over two medium Hemovac drains.  The  subcutaneous tissues were closed with 0 and 2-0 Vicryl, the subcuticular  layer closed with 4-0 Vicryl.      Robert A. Thurston Hole, M.D.  Electronically Signed     RAW/MEDQ  D:  06/15/2005  T:  06/15/2005  Job:  [id number]

## 2010-06-15 ENCOUNTER — Ambulatory Visit (INDEPENDENT_AMBULATORY_CARE_PROVIDER_SITE_OTHER): Payer: 59 | Admitting: Internal Medicine

## 2010-06-15 ENCOUNTER — Encounter: Payer: Self-pay | Admitting: Internal Medicine

## 2010-06-15 DIAGNOSIS — R300 Dysuria: Secondary | ICD-10-CM

## 2010-06-15 DIAGNOSIS — E039 Hypothyroidism, unspecified: Secondary | ICD-10-CM

## 2010-06-15 DIAGNOSIS — M810 Age-related osteoporosis without current pathological fracture: Secondary | ICD-10-CM

## 2010-06-15 DIAGNOSIS — I10 Essential (primary) hypertension: Secondary | ICD-10-CM

## 2010-06-15 DIAGNOSIS — N318 Other neuromuscular dysfunction of bladder: Secondary | ICD-10-CM

## 2010-06-15 DIAGNOSIS — M1990 Unspecified osteoarthritis, unspecified site: Secondary | ICD-10-CM

## 2010-06-15 LAB — POCT URINALYSIS DIPSTICK
Blood, UA: NEGATIVE
Glucose, UA: NEGATIVE
Protein, UA: NEGATIVE
Spec Grav, UA: 1.01
Urobilinogen, UA: 0.2

## 2010-06-15 NOTE — Patient Instructions (Signed)
Limit your sodium (Salt) intake  Avoids foods high in acid such as tomatoes citrus juices, and spicy foods.  Avoid eating within two hours of lying down or before exercising.  Do not overheat.  Try smaller more frequent meals.  If symptoms persist, elevate the head of her bed 12 inches while sleeping.    It is important that you exercise regularly, at least 20 minutes 3 to 4 times per week.  If you develop chest pain or shortness of breath seek  medical attention.  Return in 6 months for follow-up  Take a calcium supplement, plus [phone] units of vitamin D

## 2010-06-15 NOTE — Progress Notes (Signed)
  Subjective:    Patient ID: [name], female    DOB: 02-12-1930, 77 y.o.   MRN: [id number]  HPI is a 77 year old patient who is seen today for followup of her multiple medical problems she has mild cognitive impairment and has been on Aricept 10 mg daily she has osteoarthritis and osteoporosis. Apparently she is no longer taking Fosamax. She has overactive bladder other problems include osteoarthritis. She has treated hypertension which has been stable. She remains on levothyroxin for her hypothyroidism. She has been on sertraline in the past but this also has been self discontinued.      Review of Systems  Constitutional: Negative.   HENT: Negative for hearing loss, congestion, sore throat, rhinorrhea, dental problem, sinus pressure and tinnitus.   Eyes: Negative for pain, discharge and visual disturbance.  Respiratory: Negative for cough and shortness of breath.   Cardiovascular: Negative for chest pain, palpitations and leg swelling.  Gastrointestinal: Negative for nausea, vomiting, abdominal pain, diarrhea, constipation, blood in stool and abdominal distention.  Genitourinary: Negative for dysuria, urgency, frequency, hematuria, flank pain, vaginal bleeding, vaginal discharge, difficulty urinating, vaginal pain and pelvic pain.  Musculoskeletal: Positive for gait problem. Negative for joint swelling and arthralgias.       Left knee pain  Skin: Negative for rash.  Neurological: Negative for dizziness, syncope, speech difficulty, weakness, numbness and headaches.  Hematological: Negative for adenopathy.  Psychiatric/Behavioral: Negative for behavioral problems, dysphoric mood and agitation. The patient is not nervous/anxious.        Objective:   Physical Exam  Constitutional: She is oriented to person, place, and time. She appears well-developed and well-nourished.  HENT:  Head: Normocephalic.  Right Ear: External ear normal.  Left Ear: External ear normal.  Mouth/Throat:  Oropharynx is clear and moist.  Eyes: Conjunctivae and EOM are normal. Pupils are equal, round, and reactive to light.  Neck: Normal range of motion. Neck supple. No thyromegaly present.  Cardiovascular: Normal rate, regular rhythm, normal heart sounds and intact distal pulses.   Pulmonary/Chest: Effort normal and breath sounds normal.  Abdominal: Soft. Bowel sounds are normal. She exhibits no mass. There is no tenderness.  Musculoskeletal: Normal range of motion.  Lymphadenopathy:    She has no cervical adenopathy.  Neurological: She is alert and oriented to person, place, and time.  Skin: Skin is warm and dry. No rash noted.       Onychomycotic nails  Psychiatric: She has a normal mood and affect. Her behavior is normal.          Assessment & Plan:  Hypothyroidism Osteoarthritis Hypertension presently controlled off medication Overactive bladder Moderate cognitive dysfunction  Medications updated. We'll recheck in 6 months for her annual exam and laboratory updated.

## 2010-07-01 ENCOUNTER — Encounter: Payer: Self-pay | Admitting: Internal Medicine

## 2010-07-01 ENCOUNTER — Ambulatory Visit (INDEPENDENT_AMBULATORY_CARE_PROVIDER_SITE_OTHER): Payer: 59 | Admitting: Internal Medicine

## 2010-07-01 VITALS — BP 110/70 | Temp 98.1°F | Wt 119.0 lb

## 2010-07-01 DIAGNOSIS — N318 Other neuromuscular dysfunction of bladder: Secondary | ICD-10-CM

## 2010-07-01 DIAGNOSIS — R350 Frequency of micturition: Secondary | ICD-10-CM

## 2010-07-01 DIAGNOSIS — N390 Urinary tract infection, site not specified: Secondary | ICD-10-CM

## 2010-07-01 LAB — POCT URINALYSIS DIPSTICK
Ketones, UA: NEGATIVE
Spec Grav, UA: 1.03

## 2010-07-01 MED ORDER — CIPROFLOXACIN HCL 500 MG PO TABS: 500.0000 mg | ORAL_TABLET | Freq: Two times a day (BID) | ORAL | Status: AC

## 2010-07-01 NOTE — Progress Notes (Signed)
  Subjective:    Patient ID: [name], female    DOB: [birth year], 77 y.o.   MRN: [id number]  HPI 77 year old patient who has a history of overactive bladder and is on Ditropan that she takes daily. For the past 2 days she has had much more urinary frequency and dysuria. She has nocturia x4-5 last night. No fever or chills. Denies flank pain.   Review of Systems  Constitutional: Negative for fever.  Genitourinary: Positive for dysuria and frequency. Negative for hematuria, flank pain and pelvic pain.       Objective:   Physical Exam  Constitutional: She appears well-developed and well-nourished. No distress.  Abdominal: Soft. Bowel sounds are normal.       No CVA tenderness          Assessment & Plan:   UTI. Will treat with Cipro 500 twice a day for 7 days

## 2010-07-01 NOTE — Patient Instructions (Signed)
Take your antibiotic as prescribed until ALL of it is gone, but stop if you develop a rash, swelling, or any side effects of the medication.  Contact our office as soon as possible if  there are side effects of the medication.  Call or return to clinic prn if these symptoms worsen or fail to improve as anticipated.  

## 2010-07-12 ENCOUNTER — Telehealth: Payer: Self-pay

## 2010-07-12 NOTE — Telephone Encounter (Signed)
Fax from [facility] "pt states she is on higher dose." Please advise Seen 07/01/10 , current dose ditropan xl 10mg  qd

## 2010-07-15 MED ORDER — OXYBUTYNIN CHLORIDE ER 15 MG PO TB24: 15.0000 mg | ORAL_TABLET | Freq: Every day | ORAL | Status: DC

## 2010-07-15 NOTE — Telephone Encounter (Signed)
Med changes and sent new rx to [facility]

## 2010-07-15 NOTE — Telephone Encounter (Signed)
OK to increase to 15  (not 50mg ) daily

## 2010-07-15 NOTE — Telephone Encounter (Signed)
Please advise on higher dose.

## 2010-07-15 NOTE — Telephone Encounter (Signed)
Okay to increase Ditropan  dose to 50 mg daily

## 2010-07-26 ENCOUNTER — Ambulatory Visit: Payer: 59 | Admitting: Internal Medicine

## 2010-08-30 ENCOUNTER — Telehealth: Payer: Self-pay | Admitting: *Deleted

## 2010-08-30 ENCOUNTER — Encounter: Payer: Self-pay | Admitting: Podiatry

## 2010-08-30 NOTE — Telephone Encounter (Signed)
[facility] Triage Call Report Triage Record Num: [id number] Operator: Martie Lee Long Patient Name: [name] Call Date & Time: 08/28/2010 3:22:13PM Patient Phone: [phone] PCP: Gordy Savers Patient Gender: Female PCP Fax : [phone] Patient DOB: 10-13-1930 Practice Name: [facility] Reason for Call: Paula/Daugther calling about sx's of dementia. Pt as been aggressive to spouse. Pt has been hysterical today. Family does not know what to do. Pt cannot be left alone. Pt's behavior cannot be controlled. Instructed to take to ED. Protocol(s) Used: Dementia Recommended Outcome per Protocol: See ED Immediately Reason for Outcome: Agitated OR irritable AND difficult to manage Care Advice: ~ IMMEDIATE ACTION 08/28/2010 4:03:02PM Page 1 of 1 [email or web address]

## 2010-09-06 DIAGNOSIS — Z0279 Encounter for issue of other medical certificate: Secondary | ICD-10-CM

## 2010-09-07 ENCOUNTER — Telehealth: Payer: Self-pay | Admitting: Internal Medicine

## 2010-09-07 NOTE — Telephone Encounter (Signed)
Pts daughter called to check on status of a letter that pts daughter brought by yesterday for Dr Amador Cunas to review re: pts ability to make financial decisions and pts dementia. Pls call pts daughter.

## 2010-09-07 NOTE — Telephone Encounter (Signed)
Dr. Kirtland Bouchard, This patient's daughter, Gerilyn Nestle, would like to come in and meet with you alone to talk about her mother. Please advise how you would like me to schedule this. I cannot technically do it under the patient. I can set it up like a meeting, but if you are going to charge the daughter, I am not sure how that would work. Please advise on how much time you would like set aside also, and what time of day would work best for you. Thanks.

## 2010-09-07 NOTE — Telephone Encounter (Signed)
Please advise - letter done this AM

## 2010-09-07 NOTE — Telephone Encounter (Signed)
Ok to schedule - rov slot - under pt name -  Comment "daughter to consult with Dr. Kirtland Bouchard"

## 2010-09-08 ENCOUNTER — Ambulatory Visit (INDEPENDENT_AMBULATORY_CARE_PROVIDER_SITE_OTHER): Payer: Self-pay | Admitting: Internal Medicine

## 2010-09-08 DIAGNOSIS — I10 Essential (primary) hypertension: Secondary | ICD-10-CM

## 2010-09-08 NOTE — Progress Notes (Signed)
  Subjective:    Patient ID: [name], female    DOB: 1930-09-18, 77 y.o.   MRN: [id number]  HPI  the patient's daughter and son-in-law were seen today to discuss the patient's status. She has a history of moderately severe dementia which has deteriorated. The patient has recently lost her husband and now requires assistance with all aspect of daily living. She lives in a single story home. She presently has 24 7 assistance in the home. The patient requires encouragement to take her medications. She remains quite suspicious and paranoid. She does not wish to consider alternate living arrangements at this time and her family is quite supportive with her desire to live independently.  The patient remains quite anxious with poor sleep habits and excessive worry. The family requested the office call the patient to arrange followup    Review of Systems     Objective:   Physical Exam        Assessment & Plan:

## 2010-09-10 ENCOUNTER — Encounter: Payer: Self-pay | Admitting: Internal Medicine

## 2010-09-10 ENCOUNTER — Ambulatory Visit (INDEPENDENT_AMBULATORY_CARE_PROVIDER_SITE_OTHER): Payer: Medicare Other | Admitting: Internal Medicine

## 2010-09-10 DIAGNOSIS — K219 Gastro-esophageal reflux disease without esophagitis: Secondary | ICD-10-CM

## 2010-09-10 DIAGNOSIS — M1990 Unspecified osteoarthritis, unspecified site: Secondary | ICD-10-CM

## 2010-09-10 DIAGNOSIS — I10 Essential (primary) hypertension: Secondary | ICD-10-CM

## 2010-09-10 DIAGNOSIS — F0390 Unspecified dementia without behavioral disturbance: Secondary | ICD-10-CM

## 2010-09-10 LAB — CBC WITH DIFFERENTIAL/PLATELET
Basophils Absolute: 0 10*3/uL (ref 0.0–0.1)
Eosinophils Absolute: 0 10*3/uL (ref 0.0–0.7)
Lymphocytes Relative: 23.6 % (ref 12.0–46.0)
MCHC: 32.9 g/dL (ref 30.0–36.0)
Neutrophils Relative %: 66 % (ref 43.0–77.0)
Platelets: 282 10*3/uL (ref 150.0–400.0)
RBC: 4.47 Mil/uL (ref 3.87–5.11)
RDW: 14.5 % (ref 11.5–14.6)

## 2010-09-10 LAB — BASIC METABOLIC PANEL
BUN: 12 mg/dL (ref 6–23)
Calcium: 9.7 mg/dL (ref 8.4–10.5)
GFR: 87.38 mL/min (ref >60.00)
Glucose, Bld: 110 mg/dL — ABNORMAL HIGH (ref 70–99)
Sodium: 144 mEq/L (ref 135–145)

## 2010-09-10 LAB — HEPATIC FUNCTION PANEL
ALT: 19 U/L (ref 0–35)
AST: 25 U/L (ref 0–37)
Total Bilirubin: 0.8 mg/dL (ref 0.3–1.2)

## 2010-09-10 MED ORDER — MEMANTINE HCL 10 MG PO TABS: 10.0000 mg | ORAL_TABLET | Freq: Every day | ORAL | Status: DC

## 2010-09-10 NOTE — Progress Notes (Signed)
  Subjective:    Patient ID: [name], female    DOB: 29-Apr-1930, 77 y.o.   MRN: [id number]  HPI 77 year old patient who is seen today for followup. A  family conference was held 2 days ago; she has lost her husband approximately one week ago and now is living alone with 24 7 care. She has moderately severe dementia and is unable to live independently. Her daughter describe some behavioral issues with some paranoia. She does not wish to consider alternate living arrangements at this time and family is quite supportive as long as her funds last.  Her dementia has progressed she is unable to name the day or the year. She has been quite stressed over the recent death of her husband. She has overactive bladder hypothyroidism and osteoarthritis. She remains on Aricept for her dementia  Review of Systems  Constitutional: Negative.   HENT: Negative for hearing loss, congestion, sore throat, rhinorrhea, dental problem, sinus pressure and tinnitus.   Eyes: Negative for pain, discharge and visual disturbance.  Respiratory: Negative for cough and shortness of breath.   Cardiovascular: Negative for chest pain, palpitations and leg swelling.  Gastrointestinal: Negative for nausea, vomiting, abdominal pain, diarrhea, constipation, blood in stool and abdominal distention.  Genitourinary: Negative for dysuria, urgency, frequency, hematuria, flank pain, vaginal bleeding, vaginal discharge, difficulty urinating, vaginal pain and pelvic pain.  Musculoskeletal: Negative for joint swelling, arthralgias and gait problem.  Skin: Negative for rash.  Neurological: Negative for dizziness, syncope, speech difficulty, weakness, numbness and headaches.  Hematological: Negative for adenopathy.  Psychiatric/Behavioral: Positive for behavioral problems. Negative for dysphoric mood and agitation. The patient is not nervous/anxious.        Objective:   Physical Exam  Constitutional: She is oriented to person, place, and  time. She appears well-developed and well-nourished.  HENT:  Head: Normocephalic.  Right Ear: External ear normal.  Left Ear: External ear normal.  Mouth/Throat: Oropharynx is clear and moist.  Eyes: Conjunctivae and EOM are normal. Pupils are equal, round, and reactive to light.  Neck: Normal range of motion. Neck supple. No thyromegaly present.  Cardiovascular: Normal rate, regular rhythm, normal heart sounds and intact distal pulses.   Pulmonary/Chest: Effort normal and breath sounds normal.  Abdominal: Soft. Bowel sounds are normal. She exhibits no mass. There is no tenderness.  Musculoskeletal: Normal range of motion.  Lymphadenopathy:    She has no cervical adenopathy.  Neurological: She is alert and oriented to person, place, and time.  Skin: Skin is warm and dry. No rash noted.  Psychiatric: She has a normal mood and affect. Her behavior is normal.       Alert but quite confused. Unable to name the date day of the week or the year          Assessment & Plan:  Dementia. Moderately severe. We'll add Namenda to her regimen samples and a new prescription dispense  hypothyroidism. We'll check a TSH Osteoarthritis Hypertension well controlled  Will review laboratory update

## 2010-09-10 NOTE — Patient Instructions (Signed)
Take a calcium supplement, plus [phone] units of vitamin D    It is important that you exercise regularly, at least 20 minutes 3 to 4 times per week.  If you develop chest pain or shortness of breath seek  medical attention.  Limit your sodium (Salt) intake  Return in 3 months for follow-up

## 2010-09-30 ENCOUNTER — Telehealth: Payer: Self-pay | Admitting: *Deleted

## 2010-09-30 NOTE — Telephone Encounter (Signed)
Please call pt's daughter with lab results from 2 weeks ago.

## 2010-09-30 NOTE — Telephone Encounter (Signed)
Spoke with daughter - labs normal

## 2010-11-26 ENCOUNTER — Encounter: Payer: Self-pay | Admitting: Internal Medicine

## 2010-11-26 ENCOUNTER — Ambulatory Visit (INDEPENDENT_AMBULATORY_CARE_PROVIDER_SITE_OTHER): Payer: Medicare Other | Admitting: Internal Medicine

## 2010-11-26 ENCOUNTER — Other Ambulatory Visit: Payer: Self-pay | Admitting: Internal Medicine

## 2010-11-26 DIAGNOSIS — N318 Other neuromuscular dysfunction of bladder: Secondary | ICD-10-CM

## 2010-11-26 DIAGNOSIS — E039 Hypothyroidism, unspecified: Secondary | ICD-10-CM

## 2010-11-26 DIAGNOSIS — I10 Essential (primary) hypertension: Secondary | ICD-10-CM

## 2010-11-26 DIAGNOSIS — F0390 Unspecified dementia without behavioral disturbance: Secondary | ICD-10-CM

## 2010-11-26 DIAGNOSIS — M1990 Unspecified osteoarthritis, unspecified site: Secondary | ICD-10-CM

## 2010-11-26 MED ORDER — ALPRAZOLAM 0.25 MG PO TABS: 0.2500 mg | ORAL_TABLET | Freq: Every evening | ORAL | Status: DC | PRN

## 2010-11-26 MED ORDER — SERTRALINE HCL 25 MG PO TABS: 25.0000 mg | ORAL_TABLET | Freq: Every day | ORAL | Status: DC

## 2010-11-26 NOTE — Telephone Encounter (Signed)
Pt need something for daily anxiety ?zoloft not xanax unless she can take it daily not prn. Family can not control what pt takes.

## 2010-11-26 NOTE — Patient Instructions (Signed)
Return in 3 months for follow-up  

## 2010-11-26 NOTE — Progress Notes (Signed)
  Subjective:    Patient ID: [name], female    DOB: [birth year], 77 y.o.   MRN: [id number]  HPI 77 year old patient who is seen today for followup. She has a history of moderately severe dementia and Namenda was added several months ago. She lives alone but has 24 7 assistance. She is accompanied by her daughter today she lost her husband recently and there has been some insomnia and anxiety. They are requesting pharmacologic assistance. In general she has done quite well she is on medication for overactive bladder she has treated hypertension which has been quite stable    Review of Systems  Constitutional: Negative.   HENT: Negative for hearing loss, congestion, sore throat, rhinorrhea, dental problem, sinus pressure and tinnitus.   Eyes: Negative for pain, discharge and visual disturbance.  Respiratory: Negative for cough and shortness of breath.   Cardiovascular: Negative for chest pain, palpitations and leg swelling.  Gastrointestinal: Negative for nausea, vomiting, abdominal pain, diarrhea, constipation, blood in stool and abdominal distention.  Genitourinary: Negative for dysuria, urgency, frequency, hematuria, flank pain, vaginal bleeding, vaginal discharge, difficulty urinating, vaginal pain and pelvic pain.  Musculoskeletal: Negative for joint swelling, arthralgias and gait problem.  Skin: Negative for rash.  Neurological: Negative for dizziness, syncope, speech difficulty, weakness, numbness and headaches.  Hematological: Negative for adenopathy.  Psychiatric/Behavioral: Positive for sleep disturbance, dysphoric mood and decreased concentration. Negative for behavioral problems and agitation. The patient is nervous/anxious.        Objective:   Physical Exam  Constitutional: She is oriented to person, place, and time. She appears well-developed and well-nourished.  HENT:  Head: Normocephalic.  Right Ear: External ear normal.  Left Ear: External ear normal.    Mouth/Throat: Oropharynx is clear and moist.  Eyes: Conjunctivae and EOM are normal. Pupils are equal, round, and reactive to light.  Neck: Normal range of motion. Neck supple. No thyromegaly present.  Cardiovascular: Normal rate, regular rhythm, normal heart sounds and intact distal pulses.   Pulmonary/Chest: Effort normal and breath sounds normal.       A few crackles right base  Abdominal: Soft. Bowel sounds are normal. She exhibits no mass. There is no tenderness.  Musculoskeletal: Normal range of motion.  Lymphadenopathy:    She has no cervical adenopathy.  Neurological: She is alert and oriented to person, place, and time.  Skin: Skin is warm and dry. No rash noted.  Psychiatric: She has a normal mood and affect. Her behavior is normal.          Assessment & Plan:   Dementia. We'll continue present regimen Hypertension well controlled Anxiety/insomnia Overactive bladder. We'll continue present regimen

## 2010-11-26 NOTE — Telephone Encounter (Signed)
Please advise 

## 2010-11-26 NOTE — Telephone Encounter (Signed)
Spoke with daughter - new rx sent to [facility]

## 2010-11-26 NOTE — Telephone Encounter (Signed)
Sertraline 25 mg #50 one every am

## 2011-01-19 ENCOUNTER — Other Ambulatory Visit: Payer: Self-pay | Admitting: Internal Medicine

## 2011-01-25 ENCOUNTER — Other Ambulatory Visit: Payer: Self-pay | Admitting: Internal Medicine

## 2011-02-10 ENCOUNTER — Telehealth: Payer: Self-pay | Admitting: Internal Medicine

## 2011-02-10 DIAGNOSIS — F0390 Unspecified dementia without behavioral disturbance: Secondary | ICD-10-CM

## 2011-02-10 MED ORDER — MEMANTINE HCL 10 MG PO TABS: 10.0000 mg | ORAL_TABLET | Freq: Every day | ORAL | Status: DC

## 2011-02-10 NOTE — Telephone Encounter (Signed)
Pt. Is requesting a refill for memantine (NAMENDA) 10 MG tablet called into [facility]// Also daughter wanted to know for the form completion for nursing home does Dr. Kirtland Bouchard write a letter or do they need to provide paperwork from the nursing homes as they have not decided on which one they are going to send her to yet.

## 2011-02-10 NOTE — Telephone Encounter (Signed)
Rx sent to pharmacy.  Pls advise on letter.

## 2011-02-10 NOTE — Telephone Encounter (Signed)
RF  OK  Facility submits paper work

## 2011-02-11 NOTE — Telephone Encounter (Signed)
Pt's daughter is aware 

## 2011-02-28 ENCOUNTER — Ambulatory Visit: Payer: Medicare Other | Admitting: Internal Medicine

## 2011-03-07 ENCOUNTER — Other Ambulatory Visit: Payer: Self-pay | Admitting: Internal Medicine

## 2011-03-10 ENCOUNTER — Ambulatory Visit: Payer: Medicare Other | Admitting: Internal Medicine

## 2011-04-01 ENCOUNTER — Encounter: Payer: Self-pay | Admitting: Internal Medicine

## 2011-04-01 ENCOUNTER — Ambulatory Visit (INDEPENDENT_AMBULATORY_CARE_PROVIDER_SITE_OTHER): Payer: Medicare Other | Admitting: Internal Medicine

## 2011-04-01 VITALS — BP 150/90 | Temp 97.4°F | Wt 106.0 lb

## 2011-04-01 DIAGNOSIS — F0390 Unspecified dementia without behavioral disturbance: Secondary | ICD-10-CM

## 2011-04-01 DIAGNOSIS — I10 Essential (primary) hypertension: Secondary | ICD-10-CM

## 2011-04-01 DIAGNOSIS — E039 Hypothyroidism, unspecified: Secondary | ICD-10-CM

## 2011-04-01 MED ORDER — ALPRAZOLAM 0.25 MG PO TABS: ORAL_TABLET | ORAL | Status: DC

## 2011-04-01 NOTE — Progress Notes (Signed)
  Subjective:    Patient ID: [name], female    DOB: Nov 10, 1930, 78 y.o.   MRN: [id number]  HPI  78 year old patient history of moderately severe dementia. She is followed closely by ophthalmology. She's had recent eye surgery for advanced glaucoma and unfortunately has had significant visual loss. Her dementia continues to deteriorate. Her daughter who has power of attorney describes increasing anxiety and restlessness throughout the day. There's been no more confusion.    Review of Systems  Constitutional: Negative.   HENT: Negative for hearing loss, congestion, sore throat, rhinorrhea, dental problem, sinus pressure and tinnitus.   Eyes: Negative for pain, discharge and visual disturbance.  Respiratory: Negative for cough and shortness of breath.   Cardiovascular: Negative for chest pain, palpitations and leg swelling.  Gastrointestinal: Negative for nausea, vomiting, abdominal pain, diarrhea, constipation, blood in stool and abdominal distention.  Genitourinary: Negative for dysuria, urgency, frequency, hematuria, flank pain, vaginal bleeding, vaginal discharge, difficulty urinating, vaginal pain and pelvic pain.  Musculoskeletal: Negative for joint swelling, arthralgias and gait problem.  Skin: Negative for rash.  Neurological: Negative for dizziness, syncope, speech difficulty, weakness, numbness and headaches.  Hematological: Negative for adenopathy.  Psychiatric/Behavioral: Positive for confusion. Negative for behavioral problems, dysphoric mood and agitation. The patient is not nervous/anxious.        Objective:   Physical Exam  Constitutional: She is oriented to person, place, and time. She appears well-developed and well-nourished.  HENT:  Head: Normocephalic.  Right Ear: External ear normal.  Left Ear: External ear normal.  Mouth/Throat: Oropharynx is clear and moist.  Eyes: Conjunctivae and EOM are normal. Pupils are equal, round, and reactive to light.  Neck:  Normal range of motion. Neck supple. No thyromegaly present.  Cardiovascular: Normal rate, regular rhythm, normal heart sounds and intact distal pulses.   Pulmonary/Chest: Effort normal and breath sounds normal.  Abdominal: Soft. Bowel sounds are normal. She exhibits no mass. There is no tenderness.  Musculoskeletal: Normal range of motion.  Lymphadenopathy:    She has no cervical adenopathy.  Neurological: She is alert and oriented to person, place, and time.  Skin: Skin is warm and dry. No rash noted.  Psychiatric:       Moderately severe dementia          Assessment & Plan:   Dementia. We'll continue baseline medications and add alprazolam once a day in the morning Hypertension. Repeat blood pressure 120/78 Overactive bladder. Daughter states that there is considerable benefit from Ditropan so we'll continue

## 2011-04-01 NOTE — Patient Instructions (Signed)
Limit your sodium (Salt) intake  Return office visit 2 months  

## 2011-04-11 ENCOUNTER — Other Ambulatory Visit: Payer: Self-pay | Admitting: Internal Medicine

## 2011-05-05 ENCOUNTER — Telehealth: Payer: Self-pay | Admitting: *Deleted

## 2011-05-05 NOTE — Telephone Encounter (Signed)
Daughter is calling to let Dr. Kirtland Bouchard to know that her mother had mental illness before she was diagnosed with dementia.  Pt has become extremely mean to her caregivers, and family are afraid they may quit if something isn't done.  Daughter is asking if there is any other kind of meds that she might take to help the change of personality.

## 2011-05-05 NOTE — Telephone Encounter (Signed)
Need ROV next week to discuss or referral to psych

## 2011-05-06 NOTE — Telephone Encounter (Signed)
Daughter will call back for appt.

## 2011-06-06 ENCOUNTER — Encounter: Payer: Self-pay | Admitting: Internal Medicine

## 2011-06-06 ENCOUNTER — Ambulatory Visit (INDEPENDENT_AMBULATORY_CARE_PROVIDER_SITE_OTHER): Payer: Medicare Other | Admitting: Internal Medicine

## 2011-06-06 VITALS — BP 120/76 | Temp 97.8°F | Wt 106.0 lb

## 2011-06-06 DIAGNOSIS — N318 Other neuromuscular dysfunction of bladder: Secondary | ICD-10-CM

## 2011-06-06 DIAGNOSIS — E039 Hypothyroidism, unspecified: Secondary | ICD-10-CM

## 2011-06-06 DIAGNOSIS — I10 Essential (primary) hypertension: Secondary | ICD-10-CM

## 2011-06-06 DIAGNOSIS — F0390 Unspecified dementia without behavioral disturbance: Secondary | ICD-10-CM

## 2011-06-06 MED ORDER — SERTRALINE HCL 50 MG PO TABS: 50.0000 mg | ORAL_TABLET | Freq: Every day | ORAL | Status: DC

## 2011-06-06 MED ORDER — OXYBUTYNIN CHLORIDE 5 MG PO TABS: 5.0000 mg | ORAL_TABLET | Freq: Three times a day (TID) | ORAL | Status: DC

## 2011-06-06 MED ORDER — MEMANTINE HCL 10 MG PO TABS: 10.0000 mg | ORAL_TABLET | Freq: Two times a day (BID) | ORAL | Status: DC

## 2011-06-06 NOTE — Progress Notes (Signed)
  Subjective:    Patient ID: [name], female    DOB: 29-Jan-1930, 78 y.o.   MRN: [id number]  HPI  Wt Readings from Last 3 Encounters:  06/06/11 106 lb (48.081 kg)  04/01/11 106 lb (48.081 kg)  11/26/10 106 lb (48.081 kg)    Review of Systems     Objective:   Physical Exam        Assessment & Plan:

## 2011-06-06 NOTE — Progress Notes (Signed)
  Subjective:    Patient ID: [name], female    DOB: March 27, 1930, 78 y.o.   MRN: [id number]  HPI  78 year old patient who is seen today for followup. She has a history of dementia. Unfortunately she also has glaucoma and has had a loss of eyesight. She complains of a general sense of unwellness. History is hard to ascertain due to her dementia and she is accompanied by her daughter who states she basically just feels unwell. She is on sertraline for depression. She has hypothyroidism she has a history of overactive bladder which requires treatment. In view of her dementia the patient has given child off the Ditropan but she has frequent episodes of incontinence. No focal neurological symptoms other than perhaps a worsening parkinsonian type tremor involving the right arm and leg. Laboratory screen was done 8 months ago    Review of Systems  Constitutional: Positive for fatigue.  HENT: Positive for hearing loss. Negative for congestion, sore throat, rhinorrhea, dental problem, sinus pressure and tinnitus.   Eyes: Positive for visual disturbance. Negative for pain and discharge.  Respiratory: Negative for cough and shortness of breath.   Cardiovascular: Negative for chest pain, palpitations and leg swelling.  Gastrointestinal: Negative for nausea, vomiting, abdominal pain, diarrhea, constipation, blood in stool and abdominal distention.  Genitourinary: Negative for dysuria, urgency, frequency, hematuria, flank pain, vaginal bleeding, vaginal discharge, difficulty urinating, vaginal pain and pelvic pain.  Musculoskeletal: Positive for gait problem. Negative for joint swelling and arthralgias.  Skin: Negative for rash.  Neurological: Positive for weakness. Negative for dizziness, syncope, speech difficulty, numbness and headaches.  Hematological: Negative for adenopathy.  Psychiatric/Behavioral: Negative for behavioral problems, dysphoric mood and agitation. The patient is not nervous/anxious.         Objective:   Physical Exam  Constitutional: She is oriented to person, place, and time. She appears well-developed and well-nourished.  HENT:  Head: Normocephalic.  Right Ear: External ear normal.  Left Ear: External ear normal.  Mouth/Throat: Oropharynx is clear and moist.  Eyes: Conjunctivae and EOM are normal. Pupils are equal, round, and reactive to light.  Neck: Normal range of motion. Neck supple. No thyromegaly present.  Cardiovascular: Normal rate, regular rhythm, normal heart sounds and intact distal pulses.   Pulmonary/Chest: Effort normal and breath sounds normal.  Abdominal: Soft. Bowel sounds are normal. She exhibits no mass. There is no tenderness.  Musculoskeletal: Normal range of motion.  Lymphadenopathy:    She has no cervical adenopathy.  Neurological: She is alert and oriented to person, place, and time.       Moderate dementia Able to stand from a sitting position and walk across the examining room. Somewhat unsteady but not grossly ataxic Finger to nose testing unable to be performed due to her poor eyesight Exam is nonfocal Parkinsonian type tremor involving her right arm and leg  Skin: Skin is warm and dry. No rash noted.  Psychiatric: She has a normal mood and affect. Her behavior is normal.          Assessment & Plan:   Dementia Partial blindness Hearing loss Hypertension stable Hypothyroidism  We'll increase sertraline 50 mg daily and clinically observed Recheck 3 months

## 2011-06-06 NOTE — Patient Instructions (Signed)
Return in 3 months for follow-up  

## 2011-07-15 ENCOUNTER — Ambulatory Visit (INDEPENDENT_AMBULATORY_CARE_PROVIDER_SITE_OTHER): Payer: Medicare Other | Admitting: Internal Medicine

## 2011-07-15 DIAGNOSIS — N318 Other neuromuscular dysfunction of bladder: Secondary | ICD-10-CM

## 2011-07-15 MED ORDER — CIPROFLOXACIN HCL 500 MG PO TABS: 500.0000 mg | ORAL_TABLET | Freq: Two times a day (BID) | ORAL | Status: AC

## 2011-07-29 ENCOUNTER — Other Ambulatory Visit (INDEPENDENT_AMBULATORY_CARE_PROVIDER_SITE_OTHER): Payer: Medicare Other

## 2011-07-29 DIAGNOSIS — R300 Dysuria: Secondary | ICD-10-CM

## 2011-07-29 LAB — POCT URINALYSIS DIPSTICK
Glucose, UA: NEGATIVE
Nitrite, UA: NEGATIVE
Protein, UA: NEGATIVE
Urobilinogen, UA: 0.2

## 2011-08-10 ENCOUNTER — Telehealth: Payer: Self-pay | Admitting: Internal Medicine

## 2011-08-10 ENCOUNTER — Other Ambulatory Visit: Payer: Self-pay | Admitting: Internal Medicine

## 2011-08-10 NOTE — Telephone Encounter (Signed)
Caller: [name]/Child; Phone Number: [phone]; Message from caller: Daughter, [name] calling today 08/10/11 regarding Mom takes Oxybutin and it is due to be refilled, however daughter wants to know if it needs to be increased or changed b/c Mom has been going more often during the day and not able to hold it at night.  PLEASE CALL [name] BACK AT [phone] TO ADVISE.  PHARMACY IS [facility] ON [address].

## 2011-08-10 NOTE — Telephone Encounter (Signed)
Called paula (daughter) - informed dr. Amador Cunas out of office until Monday - will refill and maintain that dose until next week Forward to dr. Amador Cunas

## 2011-08-11 ENCOUNTER — Telehealth: Payer: Self-pay | Admitting: Internal Medicine

## 2011-08-11 NOTE — Telephone Encounter (Signed)
Caller: [name]; Phone Number: [phone]; Message from caller: Daughter [name]) calling back, states pharmacy did not get order for refill of Oxybutynin that was supposed to be sent in yesterday.  They gave them 3 pills til order received.  Per [facility] refill was done on yesterday.  Called [facility] and per [employer] they did receive refill order but they only had 3 pills in stock and will call when remainder of rx comes in, daughter aware.

## 2011-08-11 NOTE — Telephone Encounter (Signed)
noted 

## 2011-08-15 NOTE — Telephone Encounter (Signed)
Please call in a new prescription for generic ditropan 10 mg;  1 dose at bedtime and every 6 hours as needed

## 2011-08-15 NOTE — Telephone Encounter (Signed)
She is on 15mg  XL

## 2011-08-15 NOTE — Telephone Encounter (Signed)
Spoke with daughter and gave instructions

## 2011-08-15 NOTE — Telephone Encounter (Signed)
Continue XL 15 and add the  10 mg  HS and prn

## 2011-08-16 ENCOUNTER — Other Ambulatory Visit: Payer: Self-pay

## 2011-08-16 MED ORDER — OXYBUTYNIN CHLORIDE ER 10 MG PO TB24: 10.0000 mg | ORAL_TABLET | Freq: Every day | ORAL | Status: DC

## 2011-08-19 ENCOUNTER — Encounter: Payer: Self-pay | Admitting: Family Medicine

## 2011-08-19 ENCOUNTER — Ambulatory Visit (INDEPENDENT_AMBULATORY_CARE_PROVIDER_SITE_OTHER): Payer: Medicare Other | Admitting: Family Medicine

## 2011-08-19 VITALS — BP 122/72 | Temp 98.6°F | Wt 103.0 lb

## 2011-08-19 DIAGNOSIS — R4189 Other symptoms and signs involving cognitive functions and awareness: Secondary | ICD-10-CM

## 2011-08-19 DIAGNOSIS — F0390 Unspecified dementia without behavioral disturbance: Secondary | ICD-10-CM

## 2011-08-19 DIAGNOSIS — R300 Dysuria: Secondary | ICD-10-CM

## 2011-08-19 DIAGNOSIS — E039 Hypothyroidism, unspecified: Secondary | ICD-10-CM

## 2011-08-19 LAB — CBC WITH DIFFERENTIAL/PLATELET
Basophils Absolute: 0 10*3/uL (ref 0.0–0.1)
Eosinophils Relative: 0 % (ref 0.0–5.0)
HCT: 38.4 % (ref 36.0–46.0)
Hemoglobin: 12.5 g/dL (ref 12.0–15.0)
Lymphocytes Relative: 37.8 % (ref 12.0–46.0)
Lymphs Abs: 1.8 10*3/uL (ref 0.7–4.0)
Monocytes Relative: 11.2 % (ref 3.0–12.0)
Neutro Abs: 2.4 10*3/uL (ref 1.4–7.7)
Platelets: 202 10*3/uL (ref 150.0–400.0)
RDW: 13.6 % (ref 11.5–14.6)
WBC: 4.7 10*3/uL (ref 4.5–10.5)

## 2011-08-19 LAB — POCT URINALYSIS DIPSTICK
Bilirubin, UA: NEGATIVE
Glucose, UA: NEGATIVE
Nitrite, UA: NEGATIVE
Urobilinogen, UA: 0.2

## 2011-08-19 LAB — BASIC METABOLIC PANEL
Calcium: 10.1 mg/dL (ref 8.4–10.5)
GFR: 80.41 mL/min (ref >60.00)
Glucose, Bld: 75 mg/dL (ref 70–99)
Potassium: 3.8 mEq/L (ref 3.5–5.1)
Sodium: 140 mEq/L (ref 135–145)

## 2011-08-19 LAB — TSH: TSH: 1.16 u[IU]/mL (ref 0.35–5.50)

## 2011-08-19 NOTE — Patient Instructions (Addendum)
If labs all normal, consider decreasing Ditropan back to once daily.

## 2011-08-19 NOTE — Progress Notes (Signed)
  Subjective:    Patient ID: [name], female    DOB: [birth year], 78 y.o.   MRN: [id number]  HPI  Patient seen as a work in with possible cognitive change. She has fairly advanced dementia along with other medical problem including hypothyroidism, chronic hearing loss, hypertension, urinary urgency, osteoarthritis, and osteoporosis. Recently increased Ditropan up to twice daily. Has had some weight loss which as been relatively mild by our scales- 3 pounds over the past few months. Family and caregivers have noticed that she's had decreased appetite. Occasional complaints of dysuria. No cough. No abdominal pain. No confirmed fever. Complaining of more fatigue.  Past Medical History  Diagnosis Date  . DIVERTICULOSIS, COLON 07/13/2007  . GERD 09/25/2008  . HYPERTENSION 12/09/2008  . HYPOTHYROIDISM 07/17/2006  . OSTEOARTHRITIS 11/16/2006  . OSTEOPOROSIS 07/17/2006  . Other and unspecified hyperlipidemia 11/19/2007  . OVERACTIVE BLADDER 06/08/2009  . RHUS DERMATITIS 08/10/2007  . Unspecified hearing loss 12/09/2008  . Unspecified urinary incontinence 09/02/2009   Past Surgical History  Procedure Date  . Abdominal hysterectomy   . Tonsillectomy   . Knee arthroscopy     right  . Corneal transplant     left  . Total knee arthroplasty     reports that she quit smoking about 63 years ago. She has never used smokeless tobacco. She reports that she does not drink alcohol or use illicit drugs. family history is not on file. Allergies  Allergen Reactions  . Latex     Swelling to area of contact  . Codeine       Review of Systems  Unable to perform ROS unobtainable from pt secondary to dementia.     Objective:   Physical Exam  Constitutional:       Alert pleasant demented 78 year old female  HENT:       Tongue slightly dry otherwise clear  Neck: Neck supple. No thyromegaly present.  Cardiovascular: Normal rate and regular rhythm.   Pulmonary/Chest: Effort normal and breath  sounds normal. No respiratory distress. She has no wheezes. She has no rales.  Abdominal: Soft. She exhibits no distension and no mass. There is no tenderness. There is no rebound and no guarding.  Musculoskeletal: She exhibits no edema.  Lymphadenopathy:    She has no cervical adenopathy.  Neurological: She is alert.  Skin: No rash noted.          Assessment & Plan:  Advanced dementia with possible cognitive changes. Check labs with electrolytes, TSH, urinalysis. Question related to recent increase  Ditropan. Consider switching back to once daily if labs all normal  Hypothyroidism. Recheck TSH

## 2011-08-21 LAB — URINE CULTURE: Colony Count: 2000

## 2011-08-22 NOTE — Progress Notes (Signed)
Quick Note:  Pt daughter informed ______ 

## 2011-08-26 ENCOUNTER — Telehealth: Payer: Self-pay | Admitting: Internal Medicine

## 2011-08-26 NOTE — Telephone Encounter (Signed)
Caller: [name]; Patient Name: [name]; PCP: Eleonore Chiquito; Seen in office on 08/18/11 and Labwork done. Have not heard from office about bloodwork and requesting call back Re: results. Best Callback Phone Number: [phone]

## 2011-08-26 NOTE — Telephone Encounter (Signed)
Called and spoke with paula

## 2011-09-05 ENCOUNTER — Other Ambulatory Visit: Payer: Self-pay | Admitting: Internal Medicine

## 2011-09-06 ENCOUNTER — Ambulatory Visit: Payer: Medicare Other | Admitting: Internal Medicine

## 2011-09-28 ENCOUNTER — Other Ambulatory Visit: Payer: Self-pay | Admitting: Internal Medicine

## 2011-10-11 ENCOUNTER — Other Ambulatory Visit: Payer: Self-pay | Admitting: Internal Medicine

## 2011-10-22 NOTE — Progress Notes (Signed)
°  Subjective:    Patient ID: [name], female    DOB: 04-01-1930, 78 y.o.   MRN: [id number]  HPI    Review of Systems     Objective:   Physical Exam        Assessment & Plan:  Lab only visit

## 2011-11-04 ENCOUNTER — Other Ambulatory Visit: Payer: Self-pay | Admitting: Internal Medicine

## 2011-11-07 ENCOUNTER — Other Ambulatory Visit: Payer: Self-pay | Admitting: Internal Medicine

## 2011-12-20 ENCOUNTER — Telehealth: Payer: Self-pay | Admitting: Internal Medicine

## 2011-12-20 MED ORDER — ALPRAZOLAM 0.25 MG PO TABS: 0.2500 mg | ORAL_TABLET | Freq: Two times a day (BID) | ORAL | Status: DC | PRN

## 2011-12-20 NOTE — Telephone Encounter (Signed)
Okay to take twice daily and to call in a new prescription to reflect the twice a day dosing

## 2011-12-20 NOTE — Telephone Encounter (Signed)
Daughter is calling to request a increase in Xanax 0.25mg  to give twice a day instead of only in the am.  States that patient currently takes 1 xanax 0.25mg  in the morning and 1 setraline at night and that around 1400 patient has increased agitation and becomes combative with her dementia.  Recently  daughter gave patient a second xanax at 1400 and states that it made a significant difference in patients combativeness.  If able to increase Xanax to twice a day a  new prescription will be needed as they do not have enough medication to cover the additional amount.  Preferred pharmacy is [facility] on [address]. Patient is currently asymptomatic at this time and no triage is needed per daughter.   OFFICE NOTE: PLEASE FOLLOW UP ON SCRIPT REQUEST

## 2011-12-20 NOTE — Telephone Encounter (Signed)
Pt's daughter Gunnar Fusi notified okay to increase Xanax to twice a day as needed and Rx was called into pharmacy. Gunnar Fusi verbalized understanding.

## 2011-12-22 ENCOUNTER — Inpatient Hospital Stay (HOSPITAL_COMMUNITY)
Admission: EM | Admit: 2011-12-22 | Discharge: 2011-12-24 | DRG: 690 | Disposition: A | Discharge status: Home / Self Care | Payer: Medicare Other | Attending: Emergency Medicine | Admitting: Emergency Medicine

## 2011-12-22 ENCOUNTER — Other Ambulatory Visit: Payer: Self-pay

## 2011-12-22 ENCOUNTER — Encounter (HOSPITAL_COMMUNITY): Payer: Self-pay | Admitting: *Deleted

## 2011-12-22 ENCOUNTER — Ambulatory Visit (INDEPENDENT_AMBULATORY_CARE_PROVIDER_SITE_OTHER): Payer: Medicare Other | Admitting: Internal Medicine

## 2011-12-22 ENCOUNTER — Inpatient Hospital Stay (HOSPITAL_COMMUNITY): Payer: Medicare Other

## 2011-12-22 ENCOUNTER — Encounter: Payer: Self-pay | Admitting: Internal Medicine

## 2011-12-22 ENCOUNTER — Emergency Department (HOSPITAL_COMMUNITY): Payer: Medicare Other

## 2011-12-22 VITALS — BP 140/70

## 2011-12-22 DIAGNOSIS — R627 Adult failure to thrive: Secondary | ICD-10-CM | POA: Diagnosis present

## 2011-12-22 DIAGNOSIS — F0390 Unspecified dementia without behavioral disturbance: Secondary | ICD-10-CM

## 2011-12-22 DIAGNOSIS — H9190 Unspecified hearing loss, unspecified ear: Secondary | ICD-10-CM

## 2011-12-22 DIAGNOSIS — M810 Age-related osteoporosis without current pathological fracture: Secondary | ICD-10-CM

## 2011-12-22 DIAGNOSIS — Z79899 Other long term (current) drug therapy: Secondary | ICD-10-CM

## 2011-12-22 DIAGNOSIS — I129 Hypertensive chronic kidney disease with stage 1 through stage 4 chronic kidney disease, or unspecified chronic kidney disease: Secondary | ICD-10-CM | POA: Diagnosis present

## 2011-12-22 DIAGNOSIS — K219 Gastro-esophageal reflux disease without esophagitis: Secondary | ICD-10-CM

## 2011-12-22 DIAGNOSIS — E785 Hyperlipidemia, unspecified: Secondary | ICD-10-CM

## 2011-12-22 DIAGNOSIS — I10 Essential (primary) hypertension: Secondary | ICD-10-CM

## 2011-12-22 DIAGNOSIS — E039 Hypothyroidism, unspecified: Secondary | ICD-10-CM

## 2011-12-22 DIAGNOSIS — N318 Other neuromuscular dysfunction of bladder: Secondary | ICD-10-CM

## 2011-12-22 DIAGNOSIS — Z23 Encounter for immunization: Secondary | ICD-10-CM

## 2011-12-22 DIAGNOSIS — K5730 Diverticulosis of large intestine without perforation or abscess without bleeding: Secondary | ICD-10-CM

## 2011-12-22 DIAGNOSIS — R32 Unspecified urinary incontinence: Secondary | ICD-10-CM

## 2011-12-22 DIAGNOSIS — M1990 Unspecified osteoarthritis, unspecified site: Secondary | ICD-10-CM

## 2011-12-22 DIAGNOSIS — Z7982 Long term (current) use of aspirin: Secondary | ICD-10-CM

## 2011-12-22 DIAGNOSIS — N182 Chronic kidney disease, stage 2 (mild): Secondary | ICD-10-CM

## 2011-12-22 DIAGNOSIS — Z87891 Personal history of nicotine dependence: Secondary | ICD-10-CM

## 2011-12-22 DIAGNOSIS — I639 Cerebral infarction, unspecified: Secondary | ICD-10-CM

## 2011-12-22 DIAGNOSIS — R531 Weakness: Secondary | ICD-10-CM | POA: Diagnosis present

## 2011-12-22 DIAGNOSIS — E870 Hyperosmolality and hypernatremia: Secondary | ICD-10-CM

## 2011-12-22 DIAGNOSIS — Z66 Do not resuscitate: Secondary | ICD-10-CM | POA: Diagnosis present

## 2011-12-22 DIAGNOSIS — I6350 Cerebral infarction due to unspecified occlusion or stenosis of unspecified cerebral artery: Secondary | ICD-10-CM

## 2011-12-22 DIAGNOSIS — N390 Urinary tract infection, site not specified: Principal | ICD-10-CM

## 2011-12-22 DIAGNOSIS — R4182 Altered mental status, unspecified: Secondary | ICD-10-CM

## 2011-12-22 DIAGNOSIS — E876 Hypokalemia: Secondary | ICD-10-CM | POA: Diagnosis present

## 2011-12-22 DIAGNOSIS — L255 Unspecified contact dermatitis due to plants, except food: Secondary | ICD-10-CM

## 2011-12-22 HISTORY — DX: Unspecified glaucoma: H40.9

## 2011-12-22 LAB — URINALYSIS, ROUTINE W REFLEX MICROSCOPIC
Glucose, UA: NEGATIVE mg/dL
Ketones, ur: NEGATIVE mg/dL
Protein, ur: NEGATIVE mg/dL

## 2011-12-22 LAB — COMPREHENSIVE METABOLIC PANEL
AST: 21 U/L (ref 0–37)
BUN: 16 mg/dL (ref 6–23)
CO2: 32 mEq/L (ref 19–32)
Chloride: 103 mEq/L (ref 96–112)
Creatinine, Ser: 0.83 mg/dL (ref 0.50–1.10)
GFR calc non Af Amer: 65 mL/min — ABNORMAL LOW (ref >90)
Glucose, Bld: 97 mg/dL (ref 70–99)
Total Bilirubin: 0.2 mg/dL — ABNORMAL LOW (ref 0.3–1.2)

## 2011-12-22 LAB — CBC WITH DIFFERENTIAL/PLATELET
HCT: 37.4 % (ref 36.0–46.0)
Hemoglobin: 12.3 g/dL (ref 12.0–15.0)
Lymphocytes Relative: 27 % (ref 12–46)
Lymphs Abs: 1.7 10*3/uL (ref 0.7–4.0)
Monocytes Absolute: 0.8 10*3/uL (ref 0.1–1.0)
Monocytes Relative: 13 % — ABNORMAL HIGH (ref 3–12)
Neutro Abs: 3.3 10*3/uL (ref 1.7–7.7)
WBC: 6.1 10*3/uL (ref 4.0–10.5)

## 2011-12-22 LAB — URINE MICROSCOPIC-ADD ON

## 2011-12-22 LAB — TROPONIN I: Troponin I: <0.3 ng/mL (ref <0.30)

## 2011-12-22 MED ORDER — LEVOTHYROXINE SODIUM 100 MCG PO TABS
100.0000 ug | ORAL_TABLET | Freq: Every day | ORAL | Status: DC
  Administered 2011-12-23 – 2011-12-24 (×2): 100 ug via ORAL
  Filled 2011-12-22 (×3): qty 1

## 2011-12-22 MED ORDER — INFLUENZA VIRUS VACC SPLIT PF IM SUSP
0.5000 mL | INTRAMUSCULAR | Status: AC
  Filled 2011-12-22: qty 0.5

## 2011-12-22 MED ORDER — ENOXAPARIN SODIUM 30 MG/0.3ML ~~LOC~~ SOLN
30.0000 mg | SUBCUTANEOUS | Status: DC
  Administered 2011-12-22 – 2011-12-24 (×2): 30 mg via SUBCUTANEOUS
  Filled 2011-12-22 (×3): qty 0.3

## 2011-12-22 MED ORDER — DONEPEZIL HCL 10 MG PO TABS
10.0000 mg | ORAL_TABLET | Freq: Every day | ORAL | Status: DC
Start: 2011-12-22 — End: 2011-12-24
  Administered 2011-12-22 – 2011-12-23 (×2): 10 mg via ORAL
  Filled 2011-12-22 (×3): qty 1

## 2011-12-22 MED ORDER — ACETAMINOPHEN ER 650 MG PO TBCR: 650.0000 mg | EXTENDED_RELEASE_TABLET | Freq: Two times a day (BID) | ORAL | Status: DC

## 2011-12-22 MED ORDER — ONDANSETRON HCL 4 MG PO TABS: 4.0000 mg | ORAL_TABLET | Freq: Four times a day (QID) | ORAL | Status: DC | PRN

## 2011-12-22 MED ORDER — ALPRAZOLAM 0.25 MG PO TABS
0.2500 mg | ORAL_TABLET | Freq: Two times a day (BID) | ORAL | Status: DC
  Administered 2011-12-23 – 2011-12-24 (×3): 0.25 mg via ORAL
  Filled 2011-12-22 (×4): qty 1

## 2011-12-22 MED ORDER — POTASSIUM CHLORIDE CRYS ER 20 MEQ PO TBCR
20.0000 meq | EXTENDED_RELEASE_TABLET | Freq: Once | ORAL | Status: AC
  Administered 2011-12-22: 20 meq via ORAL
  Filled 2011-12-22: qty 2

## 2011-12-22 MED ORDER — ACETAMINOPHEN 325 MG PO TABS
650.0000 mg | ORAL_TABLET | Freq: Two times a day (BID) | ORAL | Status: DC
  Administered 2011-12-23 – 2011-12-24 (×3): 650 mg via ORAL
  Filled 2011-12-22 (×3): qty 2

## 2011-12-22 MED ORDER — DEXTROSE 5 % IV SOLN
1.0000 g | INTRAVENOUS | Status: DC
  Administered 2011-12-22 – 2011-12-23 (×2): 1 g via INTRAVENOUS
  Filled 2011-12-22 (×3): qty 10

## 2011-12-22 MED ORDER — OXYBUTYNIN CHLORIDE ER 10 MG PO TB24
10.0000 mg | ORAL_TABLET | Freq: Every evening | ORAL | Status: DC
  Administered 2011-12-22 – 2011-12-23 (×2): 10 mg via ORAL
  Filled 2011-12-22 (×3): qty 1

## 2011-12-22 MED ORDER — TRAMADOL HCL 50 MG PO TABS
50.0000 mg | ORAL_TABLET | Freq: Four times a day (QID) | ORAL | Status: DC | PRN
  Filled 2011-12-22: qty 1

## 2011-12-22 MED ORDER — SODIUM CHLORIDE 0.9 % IV SOLN
INTRAVENOUS | Status: AC
  Administered 2011-12-22: 22:00:00 via INTRAVENOUS

## 2011-12-22 MED ORDER — SERTRALINE HCL 50 MG PO TABS
50.0000 mg | ORAL_TABLET | Freq: Every evening | ORAL | Status: DC
  Administered 2011-12-23: 50 mg via ORAL
  Filled 2011-12-22 (×3): qty 1

## 2011-12-22 MED ORDER — ASPIRIN 81 MG PO TABS: 81.0000 mg | ORAL_TABLET | Freq: Every day | ORAL | Status: DC

## 2011-12-22 MED ORDER — ASPIRIN EC 81 MG PO TBEC
81.0000 mg | DELAYED_RELEASE_TABLET | Freq: Every day | ORAL | Status: DC
  Administered 2011-12-23 – 2011-12-24 (×2): 81 mg via ORAL
  Filled 2011-12-22 (×2): qty 1

## 2011-12-22 MED ORDER — OXYBUTYNIN CHLORIDE ER 15 MG PO TB24
15.0000 mg | ORAL_TABLET | Freq: Every morning | ORAL | Status: DC
  Administered 2011-12-23 – 2011-12-24 (×2): 15 mg via ORAL
  Filled 2011-12-22 (×2): qty 1

## 2011-12-22 MED ORDER — ONDANSETRON HCL 4 MG/2ML IJ SOLN: 4.0000 mg | Freq: Four times a day (QID) | INTRAMUSCULAR | Status: DC | PRN

## 2011-12-22 MED ORDER — SIMVASTATIN 10 MG PO TABS
10.0000 mg | ORAL_TABLET | Freq: Every evening | ORAL | Status: DC
  Administered 2011-12-22 – 2011-12-23 (×2): 10 mg via ORAL
  Filled 2011-12-22 (×3): qty 1

## 2011-12-22 NOTE — ED Notes (Addendum)
Sertaline 50 mg PO given at this time, medications is unable to scan or override. Pharmacy and receiving RN will be made aware.

## 2011-12-22 NOTE — Progress Notes (Signed)
Subjective:    Patient ID: [name], female    DOB: 10/08/1930, 78 y.o.   MRN: [id number]  HPI  78 year old patient who has a history of dementia who is brought in by the family today do to a general decline in her status.  For the past 7 days she has had worsening cognitive dysfunction and more recently has become much more unsteady on her feet. In the past 2 days she has been unable to walk unassisted which has worsened today.  She has been using her left hand only to feed herself for the past several days although she is right-handed.  She has a history of multi-infarct dementia and has been on Aricept.  The patient was last seen by me approximately 6 months ago and was ambulatory  without assistance at that time.  Past Medical History  Diagnosis Date  . DIVERTICULOSIS, COLON 07/13/2007  . GERD 09/25/2008  . HYPERTENSION 12/09/2008  . HYPOTHYROIDISM 07/17/2006  . OSTEOARTHRITIS 11/16/2006  . OSTEOPOROSIS 07/17/2006  . Other and unspecified hyperlipidemia 11/19/2007  . OVERACTIVE BLADDER 06/08/2009  . RHUS DERMATITIS 08/10/2007  . Unspecified hearing loss 12/09/2008  . Unspecified urinary incontinence 09/02/2009    History   Social History  . Marital Status: Married    Spouse Name: N/A    Number of Children: N/A  . Years of Education: N/A   Occupational History  . Not on file.   Social History Main Topics  . Smoking status: Former Smoker    Quit date: 01/18/1948  . Smokeless tobacco: Never Used  . Alcohol Use: No  . Drug Use: No  . Sexually Active: Not on file   Other Topics Concern  . Not on file   Social History Narrative  . No narrative on file    Past Surgical History  Procedure Date  . Abdominal hysterectomy   . Tonsillectomy   . Knee arthroscopy     right  . Corneal transplant     left  . Total knee arthroplasty     No family history on file.  Allergies  Allergen Reactions  . Latex     Swelling to area of contact  . Codeine     Current  Outpatient Prescriptions on File Prior to Visit  Medication Sig Dispense Refill  . acetaminophen (TYLENOL) 500 MG tablet Take 500 mg by mouth every 6 (six) hours as needed.        . ALPRAZolam (XANAX) 0.25 MG tablet Take 1 tablet (0.25 mg total) by mouth 2 (two) times daily as needed for sleep.  60 tablet  2  . aspirin 81 MG tablet Take 81 mg by mouth daily.        . calcium-vitamin D (CALCIUM 500 +D) 500 MG tablet Take 1 tablet by mouth 2 (two) times daily.        . Cholecalciferol (VITAMIN D3) 1000 UNITS capsule Take 1,000 Units by mouth daily.        Marland Kitchen donepezil (ARICEPT) 10 MG tablet TAKE ONE TABLET BY MOUTH EVERY DAY  90 tablet  3  . ibuprofen (ADVIL,MOTRIN) 200 MG tablet Take 200 mg by mouth every 8 (eight) hours as needed.        Marland Kitchen levothyroxine (SYNTHROID, LEVOTHROID) 100 MCG tablet TAKE ONE TABLET BY MOUTH DAILY.  90 tablet  3  . Multiple Vitamin (MULTIVITAMIN) tablet Take 1 tablet by mouth daily.        . Omega-3 Fatty Acids (FISH OIL) 1000 MG CAPS Take  1 capsule by mouth daily.        Marland Kitchen oxybutynin (DITROPAN XL) 10 MG 24 hr tablet Take 1 tablet (10 mg total) by mouth daily. This to be taken in evening (takes XL15mg  in AM )  90 tablet  1  . oxybutynin (DITROPAN XL) 15 MG 24 hr tablet TAKE ONE TABLET BY MOUTH DAILY  90 tablet  3  . sertraline (ZOLOFT) 25 MG tablet TAKE ONE TABLET BY MOUTH EVERY DAY  30 tablet  5  . sertraline (ZOLOFT) 50 MG tablet Take 1 tablet (50 mg total) by mouth daily.  90 tablet  3  . simvastatin (ZOCOR) 10 MG tablet TAKE ONE TABLET BY MOUTH EVERY DAY IN THE EVENING  90 tablet  3    BP 140/70      Review of Systems  Constitutional: Positive for activity change and fatigue. Negative for fever, appetite change and unexpected weight change.  HENT: Negative for hearing loss, ear pain, nosebleeds, congestion, sore throat, mouth sores, trouble swallowing, neck stiffness, dental problem, voice change, sinus pressure and tinnitus.   Eyes: Negative for photophobia,  pain, redness and visual disturbance.  Respiratory: Positive for cough. Negative for chest tightness and shortness of breath.   Cardiovascular: Negative for chest pain, palpitations and leg swelling.  Gastrointestinal: Negative for nausea, vomiting, abdominal pain, diarrhea, constipation, blood in stool, abdominal distention and rectal pain.  Genitourinary: Positive for frequency and difficulty urinating. Negative for dysuria, urgency, hematuria, flank pain, vaginal bleeding, vaginal discharge, genital sores, vaginal pain, menstrual problem and pelvic pain.  Musculoskeletal: Positive for gait problem. Negative for back pain and arthralgias.  Skin: Negative for rash.  Neurological: Positive for speech difficulty and weakness. Negative for dizziness, syncope, light-headedness, numbness and headaches.  Hematological: Negative for adenopathy. Does not bruise/bleed easily.  Psychiatric/Behavioral: Positive for confusion. Negative for suicidal ideas, behavioral problems, self-injury, dysphoric mood and agitation. The patient is not nervous/anxious.        Objective:   Physical Exam  Constitutional: She appears well-developed and well-nourished.       Alert with moderately severe cognitive impairment Blood pressure 140/70 left arm  HENT:  Head: Normocephalic and atraumatic.  Right Ear: External ear normal.  Left Ear: External ear normal.  Mouth/Throat: Oropharynx is clear and moist.       Slight ptosis on the left  Eyes: Conjunctivae normal and EOM are normal.  Neck: Normal range of motion. Neck supple. No JVD present. No thyromegaly present.  Cardiovascular: Normal rate, regular rhythm and intact distal pulses.   Murmur heard.      Grade 2/6 systolic murmur  Pulmonary/Chest: Effort normal. She has no wheezes. She has rales.       A few faint crackles right base  Abdominal: Soft. Bowel sounds are normal. She exhibits no distension and no mass. There is no tenderness. There is no rebound and  no guarding.  Genitourinary: Vagina normal.  Musculoskeletal: Normal range of motion. She exhibits no edema and no tenderness.  Neurological: She is alert. No cranial nerve deficit. She exhibits normal muscle tone. Coordination normal.       Patient was alert but significant cognitive impairment Left ptosis Decrease right grip strength Unable to stand without maximal assistance Parkinsonian tremor right hand Walks with very short stuttering steps with periods of freezing The left patellar and Achilles reflex  hyperreflexic  Skin: Skin is warm and dry. No rash noted.  Psychiatric: She has a normal mood and affect. Her behavior is normal.  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

## 2011-12-22 NOTE — ED Notes (Signed)
Patient returned to room from CT. Prior to being hooked back to the monitor pt transported to xray for chest xray.

## 2011-12-22 NOTE — ED Notes (Signed)
Patient transported to CT 

## 2011-12-22 NOTE — Patient Instructions (Signed)
Report to the hospital emergency room immediately for further evaluation and admission

## 2011-12-22 NOTE — ED Notes (Signed)
Xanax 0.25mg  PO given to pt at this time., unable to scan barcode or override item. Pharmacist is made aware and receiving RN will be made aware.

## 2011-12-22 NOTE — ED Provider Notes (Addendum)
History     CSN: [id number]  Arrival date & time 12/22/11  1628   First MD Initiated Contact with Patient 12/22/11 1818      Chief Complaint  Patient presents with  . Stroke Symptoms    (Consider location/radiation/quality/duration/timing/severity/associated sxs/prior treatment) Patient is a 78 y.o. female presenting with weakness. The history is provided by the patient, a relative and a caregiver.  Weakness The primary symptoms include altered mental status and focal weakness. Primary symptoms do not include loss of consciousness. Primary symptoms comment: difficulty walking and weakness > on the right The symptoms began more than 1 week ago. The symptoms are worsening. Focality: seems to be worse on the right but having difficulty with both legs.  Additional symptoms include weakness. Associated symptoms comments: Confusion and word findings problems worse than normal. Medical issues also include cerebral vascular accident. Workup history includes MRI and CT scan.    Past Medical History  Diagnosis Date  . DIVERTICULOSIS, COLON 07/13/2007  . GERD 09/25/2008  . HYPERTENSION 12/09/2008  . HYPOTHYROIDISM 07/17/2006  . OSTEOARTHRITIS 11/16/2006  . OSTEOPOROSIS 07/17/2006  . Other and unspecified hyperlipidemia 11/19/2007  . OVERACTIVE BLADDER 06/08/2009  . RHUS DERMATITIS 08/10/2007  . Unspecified hearing loss 12/09/2008  . Unspecified urinary incontinence 09/02/2009    Past Surgical History  Procedure Date  . Abdominal hysterectomy   . Tonsillectomy   . Knee arthroscopy     right  . Corneal transplant     left  . Total knee arthroplasty     No family history on file.  History  Substance Use Topics  . Smoking status: Former Smoker    Quit date: 01/18/1948  . Smokeless tobacco: Never Used  . Alcohol Use: No    OB History    Grav Para Term Preterm Abortions TAB SAB Ect Mult Living                  Review of Systems  Neurological: Positive for focal weakness and  weakness. Negative for loss of consciousness.  Psychiatric/Behavioral: Positive for altered mental status.  All other systems reviewed and are negative.    Allergies  Latex and Codeine  Home Medications   Current Outpatient Rx  Name  Route  Sig  Dispense  Refill  . ACETAMINOPHEN ER 650 MG PO TBCR   Oral   Take 650 mg by mouth 2 (two) times daily.         Marland Kitchen ALPRAZOLAM 0.25 MG PO TABS   Oral   Take 0.25 mg by mouth 2 (two) times daily.         . ASPIRIN 81 MG PO TABS   Oral   Take 81 mg by mouth daily.           Marland Kitchen CALCIUM-VITAMIN D 500 MG PO TABS   Oral   Take 1 tablet by mouth 2 (two) times daily.           . CHOLECALCIFEROL 1000 UNITS PO CAPS   Oral   Take 1,000 Units by mouth daily.           . DONEPEZIL HCL 10 MG PO TABS   Oral   Take 10 mg by mouth at bedtime.         Marland Kitchen LEVOTHYROXINE SODIUM 100 MCG PO TABS   Oral   Take 100 mcg by mouth daily.         Marland Kitchen ONE-DAILY MULTI VITAMINS PO TABS   Oral   Take 1 tablet  by mouth daily.           Marland Kitchen FISH OIL 1000 MG PO CAPS   Oral   Take 1 capsule by mouth daily.           . OXYBUTYNIN CHLORIDE ER 15 MG PO TB24   Oral   Take 15 mg by mouth every morning.         . OXYBUTYNIN CHLORIDE ER 10 MG PO TB24   Oral   Take 10 mg by mouth every evening. This to be taken in evening (takes XL15mg  in AM )         . SERTRALINE HCL 50 MG PO TABS   Oral   Take 50 mg by mouth every evening.         Marland Kitchen SIMVASTATIN 10 MG PO TABS   Oral   Take 10 mg by mouth every evening.           BP 147/62  Pulse 61  Temp 98.2 F (36.8 C) (Oral)  Resp 15  SpO2 100%  Physical Exam  Nursing note and vitals reviewed. Constitutional: She is oriented to person, place, and time. She appears well-developed and well-nourished. No distress.  HENT:  Head: Normocephalic and atraumatic.  Eyes: EOM are normal. Pupils are equal, round, and reactive to light.  Cardiovascular: Normal rate, regular rhythm, normal heart  sounds and intact distal pulses.  Exam reveals no friction rub.   No murmur heard. Pulmonary/Chest: Effort normal. She has no wheezes. She has no rhonchi. She has rales in the right lower field and the left lower field.  Abdominal: Soft. Bowel sounds are normal. She exhibits no distension. There is no tenderness. There is no rebound and no guarding.  Musculoskeletal: Normal range of motion. She exhibits no tenderness.       No edema  Neurological: She is alert and oriented to person, place, and time. She has normal strength. No cranial nerve deficit or sensory deficit.       No drift in either leg or arms.  Word finding problems and also will say statements that don't make sense.  Not ambulated due to weakness  Skin: Skin is warm and dry. No rash noted.  Psychiatric: She has a normal mood and affect. Her behavior is normal.    ED Course  Procedures (including critical care time)  Labs Reviewed  CBC WITH DIFFERENTIAL - Abnormal; Notable for the following:    Monocytes Relative 13 (*)     All other components within normal limits  COMPREHENSIVE METABOLIC PANEL - Abnormal; Notable for the following:    Potassium 3.4 (*)     Albumin 3.4 (*)     Total Bilirubin 0.2 (*)     GFR calc non Af Amer 65 (*)     GFR calc Af Amer 76 (*)     All other components within normal limits  URINALYSIS, ROUTINE W REFLEX MICROSCOPIC - Abnormal; Notable for the following:    APPearance HAZY (*)     Hgb urine dipstick TRACE (*)     Leukocytes, UA MODERATE (*)     All other components within normal limits  URINE MICROSCOPIC-ADD ON - Abnormal; Notable for the following:    Squamous Epithelial / LPF FEW (*)     Bacteria, UA FEW (*)     All other components within normal limits  TROPONIN I  URINE CULTURE   Dg Chest 2 View  12/22/2011  *RADIOLOGY REPORT*  Clinical Data: Possible TIA.  Rales.  CHEST - 2 VIEW  Comparison: 06/13/2006  Findings: Borderline cardiomegaly is stable.  Mediastinal hilar contours are  stable.  Lung volumes are low, with streaky bibasilar atelectasis.  No airspace disease or pleural effusion is identified.  No acute bony abnormality.  IMPRESSION: Bibasilar atelectasis.  Stable borderline cardiomegaly.   Original Report Authenticated By: Britta Mccreedy, M.D.    Ct Head Wo Contrast  12/22/2011  *RADIOLOGY REPORT*  Clinical Data:  Stroke symptoms.  History of dementia.  Decline in mental status.  Worsening cognitive dysfunction for the last seven days.  Unsteady on her feet.  CT HEAD WITHOUT CONTRAST  Technique:  Contiguous axial images were obtained from the base of the skull through the vertex without contrast.  Comparison: 08/13/2008  Findings: There is significant central and cortical atrophy. Periventricular white matter changes are consistent with small vessel disease. There is no evidence for hemorrhage, mass lesion, or acute infarction.  Bone windows show no acute abnormality.  There are postsurgical changes associated with the left globe.  IMPRESSION:  1.  Significant atrophy and small vessel disease. 2. No evidence for acute intracranial abnormality.   Original Report Authenticated By: Norva Pavlov, M.D.      Date: 12/22/2011  Rate: 72  Rhythm: normal sinus rhythm  QRS Axis: normal  Intervals: normal  ST/T Wave abnormalities: nonspecific ST changes  Conduction Disutrbances:none  Narrative Interpretation:   Old EKG Reviewed: unchanged    1. UTI (lower urinary tract infection)   2. Weakness       MDM   Per caretaker and family patient with worsening weakness on the right side as well as difficulty ambulating and confusion that has been worsening over the last one to 2 weeks. She has a history of vascular dementia with strokes in the past. They deny any fever, cough, abdominal pain or vomiting. On exam patient is confused with word finding difficulties and saying things that don't make sense. However she has normal strength in bilateral lower and upper extremities.   Heart sounds normal but lungs have crackles in the lower lobes. She has no signs of fluid overload on exam.  Pt seen by PCP today who felt she may have subacute CVA due to worsening gait and right sided weakness.  Concern for UTI versus other infectious etiology causing symptoms versus subacute stroke. CT of the head, chest x-ray pending. CBC, CMP within normal limits. Troponin negative. EKG within normal limits. UA pending  7:47 PM Head CT negative for acute findings. UA consistent with UTI. This is most likely the cause of her symptoms today. Patient started on Rocephin and admitted she is unable to ambulate an extremely confused.     Gwyneth Sprout, MD 12/22/11 8119  Gwyneth Sprout, MD 12/22/11 2004

## 2011-12-22 NOTE — ED Notes (Signed)
MD at bedside with plan of care update with family at bedside. Family verbalizes understanding of plan of care. Pt daughter states pt unable to verbalize/recognize her is new confusion at this time. Pt has no obvious neuro deficits noted or reported at this time and will continue to monitor pt.

## 2011-12-22 NOTE — H&P (Addendum)
[facility] History and Physical  BLUMA BURESH WUJ:[id number] DOB: [birth year] DOA: 12/22/2011  Referring physician: ED physician PCP: Rogelia Boga, MD   Chief Complaint: Altered mental status   HPI:  Pt is 78 yo female with progressive dementia who was brought in by family due to main concern of progressive decline in functional status associated with confusion, difficulty with speech and difficulty with ambulating (unsteady gait). Please note that most of this history is provided by family at bedside as pt is unable to provide detailed history. Per family member, pt has been using her left hand only to feed herself for the past few days even though she is right handed. Per PCP, pt was last seen in his office 6 months prior to this admission and was reportedly ambulatory. There is no reported known chest pain, shortness of breath, specific abdominal or urinary concerns. Urinary frequency and urgency noted by family member. Pt lives at home with daughter and caregiver.  ED EVENTS: Pt very confused and not following commands, UA suspicious for UTI, pt given one dose of Rocephin, admission requested to medical floor.  Assessment and Plan:  Principal Problem:  *Altered mental status - unclear etiology at this time and likely multifactorial in etiology, secondary to progressive failure to thrive and deconditioning, ?UTI, CVA vs TIA - will admit the pt to medical floor for now as I do not see any active cardiac issues necessitating telemetry admission - CT head is unremarkable but we may proceed with MRI brain to rule out CVA - I do not see recent 2 D ECHO on the chart and we may hold off on it unless MRI shows stroke - UTI is certainly in differential but pt is afebrile and no leukocytosis noted on CBC - will continue supportive care for now IVF, empiric ABX - PT/OT evaluation - follow up on urine culture  Active Problems:  Generalized weakness - again this is likely  multifactorial in etiology as noted above - PT/OT evaluation once pt able to participate   UTI - will need to follow up on urine cultures - Rocephin for now  Dementia - progressive - continue Aricept   CKD (chronic kidney disease), stage II - please note that this is based on GFR rather than creatinine, which is currently stable and within normal limits   HYPOTHYROIDISM - continue Synthroid  OVERACTIVE BLADDER - continue Oxybutynin   Hypokalemia - mild - will supplement one time dose K-dur - BMP in AM  Code Status: Full Family Communication: Family at bedside Disposition Plan: PT/OT evaluation   Review of Systems:  Unable to obtain due to confusion    Past Medical History  Diagnosis Date  . DIVERTICULOSIS, COLON 07/13/2007  . GERD 09/25/2008  . HYPERTENSION 12/09/2008  . HYPOTHYROIDISM 07/17/2006  . OSTEOARTHRITIS 11/16/2006  . OSTEOPOROSIS 07/17/2006  . Other and unspecified hyperlipidemia 11/19/2007  . OVERACTIVE BLADDER 06/08/2009  . RHUS DERMATITIS 08/10/2007  . Unspecified hearing loss 12/09/2008  . Unspecified urinary incontinence 09/02/2009  . Glaucoma     to left eye    Past Surgical History  Procedure Date  . Abdominal hysterectomy   . Tonsillectomy   . Knee arthroscopy     right  . Corneal transplant     left  . Total knee arthroplasty     Social History:  reports that she quit smoking about 63 years ago. She has never used smokeless tobacco. She reports that she does not drink alcohol or use illicit drugs.  Allergies  Allergen Reactions  . Latex Other (See Comments)    Swelling to area of contact  . Codeine Nausea And Vomiting    No family history of strokes, heart diseases, cancers.   Prior to Admission medications   Medication Sig Start Date End Date Taking? Authorizing Provider  acetaminophen (TYLENOL) 650 MG CR tablet Take 650 mg by mouth 2 (two) times daily.   Yes Historical Provider, MD  ALPRAZolam (XANAX) 0.25 MG tablet Take 0.25 mg by  mouth 2 (two) times daily. 12/20/11  Yes Gordy Savers, MD  aspirin 81 MG tablet Take 81 mg by mouth daily.     Yes Historical Provider, MD  calcium-vitamin D (CALCIUM 500 +D) 500 MG tablet Take 1 tablet by mouth 2 (two) times daily.     Yes Historical Provider, MD  Cholecalciferol (VITAMIN D3) 1000 UNITS capsule Take 1,000 Units by mouth daily.     Yes Historical Provider, MD  donepezil (ARICEPT) 10 MG tablet Take 10 mg by mouth at bedtime.   Yes Historical Provider, MD  levothyroxine (SYNTHROID, LEVOTHROID) 100 MCG tablet Take 100 mcg by mouth daily.   Yes Historical Provider, MD  Multiple Vitamin (MULTIVITAMIN) tablet Take 1 tablet by mouth daily.     Yes Historical Provider, MD  Omega-3 Fatty Acids (FISH OIL) 1000 MG CAPS Take 1 capsule by mouth daily.     Yes Historical Provider, MD  oxybutynin (DITROPAN XL) 15 MG 24 hr tablet Take 15 mg by mouth every morning.   Yes Historical Provider, MD  oxybutynin (DITROPAN-XL) 10 MG 24 hr tablet Take 10 mg by mouth every evening. This to be taken in evening (takes XL15mg  in AM ) 08/16/11  Yes Gordy Savers, MD  sertraline (ZOLOFT) 50 MG tablet Take 50 mg by mouth every evening. 06/06/11 06/05/12 Yes Gordy Savers, MD  simvastatin (ZOCOR) 10 MG tablet Take 10 mg by mouth every evening.   Yes Historical Provider, MD    Physical Exam: Filed Vitals:   12/22/11 1830 12/22/11 1845 12/22/11 1940 12/22/11 1952  BP: 137/70 122/64 138/73   Pulse: 67 77 70   Temp:    97.5 F (36.4 C)  TempSrc:    Oral  Resp: 17 23 13    SpO2: 100% 100% 100%     Physical Exam  Constitutional: Appears to be in no distress.  HENT: Normocephalic. External right and left ear normal. Dry MM Eyes: Conjunctivae normal. PERRLA, no scleral icterus. Left ptosis Neck: Normal ROM. Neck supple. No JVD. No tracheal deviation. No thyromegaly.  CVS: RRR, S1/S2 +, SEM 2/6, no gallops, no carotid bruit.  Pulmonary: Effort and breath sounds normal, decreased breath sounds at  bases  Abdominal: Soft. BS +,  no distension, tenderness, rebound or guarding.  Musculoskeletal: Normal range of motion. No edema and no tenderness.  Lymphadenopathy: No lymphadenopathy noted, cervical, inguinal. Neuro: Alert and oriented to name only. Follows some commands appropriately, decreased strength in RUE > LUE Skin: Skin is warm and dry. No rash noted. Not diaphoretic. No erythema. No pallor.  Psychiatric: Normal affect.   Labs on Admission:  Basic Metabolic Panel:  Lab 12/22/11 1610  NA 143  K 3.4*  CL 103  CO2 32  GLUCOSE 97  BUN 16  CREATININE 0.83  CALCIUM 9.6  MG --  PHOS --   Liver Function Tests:  Lab 12/22/11 1635  AST 21  ALT 14  ALKPHOS 76  BILITOT 0.2*  PROT 7.1  ALBUMIN 3.4*  CBC:  Lab 12/22/11 1635  WBC 6.1  NEUTROABS 3.3  HGB 12.3  HCT 37.4  MCV 92.3  PLT 209   Cardiac Enzymes:  Lab 12/22/11 1635  CKTOTAL --  CKMB --  CKMBINDEX --  TROPONINI <0.30    Radiological Exams on Admission: Dg Chest 2 View  12/22/2011  *RADIOLOGY REPORT*  Clinical Data: Possible TIA.  Rales.  CHEST - 2 VIEW  Comparison: 06/13/2006  Findings: Borderline cardiomegaly is stable.  Mediastinal hilar contours are stable.  Lung volumes are low, with streaky bibasilar atelectasis.  No airspace disease or pleural effusion is identified.  No acute bony abnormality.  IMPRESSION: Bibasilar atelectasis.  Stable borderline cardiomegaly.   Original Report Authenticated By: Britta Mccreedy, M.D.    Ct Head Wo Contrast  12/22/2011  *RADIOLOGY REPORT*  Clinical Data:  Stroke symptoms.  History of dementia.  Decline in mental status.  Worsening cognitive dysfunction for the last seven days.  Unsteady on her feet.  CT HEAD WITHOUT CONTRAST  Technique:  Contiguous axial images were obtained from the base of the skull through the vertex without contrast.  Comparison: 08/13/2008  Findings: There is significant central and cortical atrophy. Periventricular white matter changes are  consistent with small vessel disease. There is no evidence for hemorrhage, mass lesion, or acute infarction.  Bone windows show no acute abnormality.  There are postsurgical changes associated with the left globe.  IMPRESSION:  1.  Significant atrophy and small vessel disease. 2. No evidence for acute intracranial abnormality.   Original Report Authenticated By: Norva Pavlov, M.D.     EKG: Normal sinus rhythm, no ST/T wave changes  Debbora Presto, MD  [facility] Pager [phone]  If 7PM-7AM, please contact night-coverage [email or web address] Password [email or web address] 12/22/2011, 8:07 PM

## 2011-12-22 NOTE — ED Notes (Signed)
The pt has had more difficulty walking for the past 2-3 days and she has dementia.  She was sent here from her doctors office.  The daughter with her reports that she cannot stand without much assistance

## 2011-12-23 DIAGNOSIS — R4182 Altered mental status, unspecified: Secondary | ICD-10-CM

## 2011-12-23 DIAGNOSIS — E870 Hyperosmolality and hypernatremia: Secondary | ICD-10-CM

## 2011-12-23 DIAGNOSIS — E039 Hypothyroidism, unspecified: Secondary | ICD-10-CM

## 2011-12-23 LAB — CBC
MCH: 29.9 pg (ref 26.0–34.0)
MCHC: 32.5 g/dL (ref 30.0–36.0)
MCV: 92.2 fL (ref 78.0–100.0)
Platelets: 209 10*3/uL (ref 150–400)
RBC: 4.11 MIL/uL (ref 3.87–5.11)
RDW: 13.3 % (ref 11.5–15.5)

## 2011-12-23 LAB — BASIC METABOLIC PANEL
BUN: 11 mg/dL (ref 6–23)
CO2: 29 mEq/L (ref 19–32)
Calcium: 9.1 mg/dL (ref 8.4–10.5)
Creatinine, Ser: 0.67 mg/dL (ref 0.50–1.10)
GFR calc non Af Amer: 81 mL/min — ABNORMAL LOW (ref >90)
Glucose, Bld: 94 mg/dL (ref 70–99)
Sodium: 147 mEq/L — ABNORMAL HIGH (ref 135–145)

## 2011-12-23 MED ORDER — SODIUM CHLORIDE 0.45 % IV SOLN
INTRAVENOUS | Status: DC
  Administered 2011-12-23 (×2): via INTRAVENOUS

## 2011-12-23 MED ORDER — ALPRAZOLAM 0.25 MG PO TABS
0.2500 mg | ORAL_TABLET | Freq: Two times a day (BID) | ORAL | Status: DC | PRN
  Administered 2011-12-23: 0.25 mg via ORAL
  Filled 2011-12-23: qty 1

## 2011-12-23 MED ORDER — LORAZEPAM 2 MG/ML IJ SOLN
INTRAMUSCULAR | Status: AC
  Filled 2011-12-23: qty 1

## 2011-12-23 MED ORDER — LORAZEPAM 2 MG/ML IJ SOLN
1.0000 mg | Freq: Four times a day (QID) | INTRAMUSCULAR | Status: DC | PRN
  Administered 2011-12-23 – 2011-12-24 (×2): 1 mg via INTRAVENOUS
  Filled 2011-12-23: qty 1

## 2011-12-23 NOTE — Evaluation (Addendum)
Physical Therapy Evaluation Patient Details Name: [name] MRN: [id number] DOB: 01/08/1931 Today's Date: 12/23/2011 Time: 4098-1191 PT Time Calculation (min): 16 min  PT Assessment / Plan / Recommendation Clinical Impression  Pt adm with AMS and UTI.  Pt improved since yesterday per caregiver.  Not far from baseline. Should be able to return home with her 24 hour caregivers.    PT Assessment  Patient needs continued PT services    Follow Up Recommendations  No PT follow up    Does the patient have the potential to tolerate intense rehabilitation      Barriers to Discharge        Equipment Recommendations  None recommended by PT    Recommendations for Other Services     Frequency Min 3X/week    Precautions / Restrictions Precautions Precautions: Fall Restrictions Weight Bearing Restrictions: No   Pertinent Vitals/Pain No signs of pain.      Mobility  Bed Mobility Bed Mobility: Supine to Sit;Sitting - Scoot to Edge of Bed Supine to Sit: 3: Mod assist Sitting - Scoot to Edge of Bed: 3: Mod assist Transfers Transfers: Sit to Stand;Stand to Sit Sit to Stand: 3: Mod assist;With upper extremity assist;From bed Stand to Sit: 4: Min assist;To chair/3-in-1 Details for Transfer Assistance: Pt with initial posterior lean. Ambulation/Gait Ambulation/Gait Assistance: 3: Mod assist;4: Min assist Ambulation Distance (Feet): 150 Feet Assistive device: 1 person hand held assist Ambulation/Gait Assistance Details: Pt with tactile verbal cues to continue amb and to incr step length.  Pt with stuttering steps at times and even bouncing on toes when stationery. Gait Pattern: Decreased step length - right;Decreased step length - left;Shuffle General Gait Details: Assistance varied.    Shoulder Instructions     Exercises     PT Diagnosis: Difficulty walking  PT Problem List: Decreased activity tolerance;Decreased mobility PT Treatment Interventions: Gait  training;Functional mobility training;Patient/family education;Therapeutic activities   PT Goals Acute Rehab PT Goals PT Goal Formulation: Patient unable to participate in goal setting Time For Goal Achievement: 12/30/11 Potential to Achieve Goals: Fair Pt will go Supine/Side to Sit: with min assist PT Goal: Supine/Side to Sit - Progress: Goal set today Pt will go Sit to Supine/Side: with min assist PT Goal: Sit to Supine/Side - Progress: Goal set today Pt will go Sit to Stand: with min assist PT Goal: Sit to Stand - Progress: Goal set today Pt will go Stand to Sit: with min assist PT Goal: Stand to Sit - Progress: Goal set today Pt will Ambulate: 51 - 150 feet;with min assist PT Goal: Ambulate - Progress: Goal set today  Visit Information  Last PT Received On: 12/23/11 Assistance Needed: +1    Subjective Data  Subjective: "Are you going to fix this?" pt asking. Unsure of what pt is talking about. Patient Stated Goal: Pt unable to state.   Prior Functioning  Home Living Lives With: Daughter (and hired caregiver) Available Help at Discharge: Personal care attendant;Available 24 hours/day Type of Home: House Home Access: Level entry Home Layout: One level Prior Function Level of Independence: Needs assistance Needs Assistance: Bathing;Dressing;Grooming;Toileting;Gait;Transfers Gait Assistance: 1 person assist Transfer Assistance: 1 person assist Driving: No Vocation: Retired    Probation officer Status: History of cognitive impairments - at baseline Arousal/Alertness: Awake/alert Orientation Level: Disoriented to;Place;Time;Situation Behavior During Session: Restless    Extremity/Trunk Assessment Right Lower Extremity Assessment RLE ROM/Strength/Tone: Deficits RLE ROM/Strength/Tone Deficits: functionally 4/5 Left Lower Extremity Assessment LLE ROM/Strength/Tone: Deficits LLE ROM/Strength/Tone Deficits: functionally  4/5   Balance    End of Session     GP Functional Assessment Tool Used: clinical judgement Functional Limitation: Mobility: Walking and moving around Mobility: Walking and Moving Around Current Status [phone]): At least 20 percent but less than 40 percent impaired, limited or restricted Mobility: Walking and Moving Around Goal Status [phone]): At least 1 percent but less than 20 percent impaired, limited or restricted   [facility] 12/23/2011, 11:21 AM  [facility] [phone]

## 2011-12-23 NOTE — Progress Notes (Signed)
PATIENT DETAILS Name: [name] Age: 78 y.o. Sex: female Date of Birth: 1930/09/28 Admit Date: 12/22/2011 Admitting Physician Dorothea Ogle, MD [id number] Homero Fellers, MD  Subjective: Admitted with worsening confusion, unsteady gait  Assessment/Plan: Principal Problem:  *Altered mental status -Likely toxic metabolite encephalopathy from urinary tract infection and also just progression of underlying multi-infarct  dementia. Treat underlying UTI and see if patient is back to baseline. MRI of the brain does not show any acute infarct.  Active Problems: Urinary tract infection -Afebrile without leukocytosis -Continue with Rocephin and await urine culture results   HYPOTHYROIDISM -Continue with levothyroxine   OVERACTIVE BLADDER -Continue with Ditropan -This is stable and is a chronic issue   Dementia with delirium - Per family, patient has had progressive worsening of her dementia and gets very tedious at times. She now has 24/7 care at home. I did explain to her daughter the natural progression of the disease does involve periodic flareups with worsening confusion/agitation. Also did explain that worsening gait, dysphagia, incontinence or part of this disease.    Generalized weakness -Secondary to failure to thrive and advanced dementia  -Appreciate PT eval    Mild hypernatremia  -Change to half-normal and recheck electrolytes in a.m.    CKD (chronic kidney disease), stage II -At baseline   Disposition: Remain inpatient-home in the next 1-2 days  DVT Prophylaxis: Prophylactic Lovenox   Code Status:  DNR (long discussion with daughter and granddaughter at bedside-they chose patient to be DO NOT RESUSCITATE)   Procedures:  None  CONSULTS:  None  PHYSICAL EXAM: Vital signs in last 24 hours: Filed Vitals:   12/22/11 2151 12/22/11 2307 12/23/11 0623 12/23/11 1310  BP: 136/69 118/66 115/55 159/84  Pulse: 70 67 73 79  Temp:  97.2 F (36.2 C) 98 F  (36.7 C) 98.2 F (36.8 C)  TempSrc:  Oral Oral Oral  Resp: 12 16 17 18   Height:  5\' 2"  (1.575 m)    Weight:  48.1 kg (106 lb 0.7 oz)    SpO2: 100% 100% 100% 98%    Weight change:  Body mass index is 19.40 kg/(m^2).   Gen Exam: Awake and but pleasantly confused with clear speech.   Neck: Supple, No JVD.   Chest: B/L Clear.   CVS: S1 S2 Regular, no murmurs.  Abdomen: soft, BS +, non tender, non distended.  Extremities: no edema, lower extremities warm to touch. Neurologic: Non Focal.   Skin: No Rash.   Wounds: N/A.    Intake/Output from previous day:  Intake/Output Summary (Last 24 hours) at 12/23/11 1449 Last data filed at 12/23/11 0630  Gross per 24 hour  Intake  647.5 ml  Output   1400 ml  Net -752.5 ml     LAB RESULTS: CBC  Lab 12/23/11 0640 12/22/11 1635  WBC 4.7 6.1  HGB 12.3 12.3  HCT 37.9 37.4  PLT 209 209  MCV 92.2 92.3  MCH 29.9 30.4  MCHC 32.5 32.9  RDW 13.3 13.2  LYMPHSABS -- 1.7  MONOABS -- 0.8  EOSABS -- 0.3  BASOSABS -- 0.0  BANDABS -- --    Chemistries   Lab 12/23/11 0640 12/22/11 1635  NA 147* 143  K 4.1 3.4*  CL 109 103  CO2 29 32  GLUCOSE 94 97  BUN 11 16  CREATININE 0.67 0.83  CALCIUM 9.1 9.6  MG -- 2.1    CBG: No results found for this basename: GLUCAP:5 in the last 168 hours  GFR Estimated  Creatinine Clearance: 43.3 ml/min (by C-G formula based on Cr of 0.67).  Coagulation profile No results found for this basename: INR:5,PROTIME:5 in the last 168 hours  Cardiac Enzymes  Lab 12/22/11 1635  CKMB --  TROPONINI <0.30  MYOGLOBIN --    No components found with this basename: POCBNP:3 No results found for this basename: DDIMER:2 in the last 72 hours No results found for this basename: HGBA1C:2 in the last 72 hours No results found for this basename: CHOL:2,HDL:2,LDLCALC:2,TRIG:2,CHOLHDL:2,LDLDIRECT:2 in the last 72 hours No results found for this basename: TSH,T4TOTAL,FREET3,T3FREE,THYROIDAB in the last 72 hours No  results found for this basename: VITAMINB12:2,FOLATE:2,FERRITIN:2,TIBC:2,IRON:2,RETICCTPCT:2 in the last 72 hours No results found for this basename: LIPASE:2,AMYLASE:2 in the last 72 hours  Urine Studies No results found for this basename: UACOL:2,UAPR:2,USPG:2,UPH:2,UTP:2,UGL:2,UKET:2,UBIL:2,UHGB:2,UNIT:2,UROB:2,ULEU:2,UEPI:2,UWBC:2,URBC:2,UBAC:2,CAST:2,CRYS:2,UCOM:2,BILUA:2 in the last 72 hours  MICROBIOLOGY: No results found for this or any previous visit (from the past 240 hour(s)).  RADIOLOGY STUDIES/RESULTS: Dg Chest 2 View  12/22/2011  *RADIOLOGY REPORT*  Clinical Data: Possible TIA.  Rales.  CHEST - 2 VIEW  Comparison: 06/13/2006  Findings: Borderline cardiomegaly is stable.  Mediastinal hilar contours are stable.  Lung volumes are low, with streaky bibasilar atelectasis.  No airspace disease or pleural effusion is identified.  No acute bony abnormality.  IMPRESSION: Bibasilar atelectasis.  Stable borderline cardiomegaly.   Original Report Authenticated By: Britta Mccreedy, M.D.    Ct Head Wo Contrast  12/22/2011  *RADIOLOGY REPORT*  Clinical Data:  Stroke symptoms.  History of dementia.  Decline in mental status.  Worsening cognitive dysfunction for the last seven days.  Unsteady on her feet.  CT HEAD WITHOUT CONTRAST  Technique:  Contiguous axial images were obtained from the base of the skull through the vertex without contrast.  Comparison: 08/13/2008  Findings: There is significant central and cortical atrophy. Periventricular white matter changes are consistent with small vessel disease. There is no evidence for hemorrhage, mass lesion, or acute infarction.  Bone windows show no acute abnormality.  There are postsurgical changes associated with the left globe.  IMPRESSION:  1.  Significant atrophy and small vessel disease. 2. No evidence for acute intracranial abnormality.   Original Report Authenticated By: Norva Pavlov, M.D.    Mr Brain Wo Contrast  12/22/2011  *RADIOLOGY REPORT*   Clinical Data: Functional decline with confusion and difficulty with speech.  Difficulty ambulating with unsteady gait.  MRI HEAD WITHOUT CONTRAST  Technique:  Multiplanar, multiecho pulse sequences of the brain and surrounding structures were obtained according to standard protocol without intravenous contrast.  Comparison: 12/22/2011 and 08/13/2008 CT.  No comparison MR.  Findings: The patient had difficulty holding still and completing all sequences.  Images are motion degraded.  No acute infarct.  No intracranial hemorrhage.  Mild small vessel disease type changes.  No intracranial mass lesion detected on this unenhanced exam.  Global moderate atrophy.  Ventricular prominence without change compared to 2010 and may be related to central atrophy however, difficult to completely exclude mild component of  hydrocephalus.  Major intracranial vascular structures are patent with small vertebral arteries and basilar artery.  Minimal ethmoid sinus air cell mucosal thickening.  IMPRESSION: The patient had difficulty holding still and completing all sequences.  Images are motion degraded.  No acute infarct.  Mild small vessel disease type changes.  Global moderate atrophy.  Ventricular prominence without change compared to 2010 and may be related to central atrophy however, difficult to completely exclude mild component of  hydrocephalus.   Original Report Authenticated  By: Lacy Duverney, M.D.     MEDICATIONS: Scheduled Meds:   . acetaminophen  650 mg Oral BID  . ALPRAZolam  0.25 mg Oral BID  . aspirin EC  81 mg Oral Daily  . cefTRIAXone (ROCEPHIN)  IV  1 g Intravenous Q24H  . donepezil  10 mg Oral QHS  . enoxaparin (LOVENOX) injection  30 mg Subcutaneous Q24H  . influenza  inactive virus vaccine  0.5 mL Intramuscular Tomorrow-1000  . levothyroxine  100 mcg Oral Daily  . oxybutynin  15 mg Oral q morning - 10a  . oxybutynin  10 mg Oral QPM  . [COMPLETED] potassium chloride  20 mEq Oral Once  . sertraline   50 mg Oral QPM  . simvastatin  10 mg Oral QPM  . [DISCONTINUED] acetaminophen  650 mg Oral BID  . [DISCONTINUED] aspirin  81 mg Oral Daily   Continuous Infusions:   . sodium chloride 75 mL/hr at 12/23/11 1032  . [EXPIRED] sodium chloride 75 mL/hr at 12/22/11 2202   PRN Meds:.ALPRAZolam, ondansetron (ZOFRAN) IV, ondansetron, traMADol  Antibiotics: Anti-infectives     Start     Dose/Rate Route Frequency Ordered Stop   12/22/11 2015   cefTRIAXone (ROCEPHIN) 1 g in dextrose 5 % 50 mL IVPB        1 g 100 mL/hr over 30 Minutes Intravenous Every 24 hours 12/22/11 Dorna Mai, MD  [facility] Pager:[phone]  If 7PM-7AM, please contact night-coverage [email or web address] Password [email or web address] 12/23/2011, 2:49 PM   LOS: 1 day

## 2011-12-23 NOTE — Evaluation (Signed)
Occupational Therapy Evaluation Patient Details Name: [name] MRN: [id number] DOB: 10-Nov-1930 Today's Date: 12/23/2011 Time: 4098-1191 OT Time Calculation (min): 31 min  OT Assessment / Plan / Recommendation Clinical Impression  Pt admitted with AMS and UTI.  Hx of dementia at baseline and has 24/7 assist from caregiver at home.  Caregiver reports that pt fluctuates day to day with level of assist.  Anticipate that pt will likely be able to d/c home at basline level. If pt continues to require +2 assist with transfers, may need to consider SNF.       OT Assessment  Patient needs continued OT Services    Follow Up Recommendations  No OT follow up (SNF if pt does not progress)    Barriers to Discharge      Equipment Recommendations  3 in 1 bedside comode    Recommendations for Other Services    Frequency  Min 2X/week    Precautions / Restrictions Precautions Precautions: Fall   Pertinent Vitals/Pain See vitals    ADL  Grooming: Performed;Wash/dry face;+1 Total assistance Where Assessed - Grooming: Supported sitting Lower Body Dressing: Performed;+1 Total assistance Where Assessed - Lower Body Dressing: Supported sitting Toilet Transfer: Performed;+2 Total assistance Toilet Transfer: Patient Percentage: 10% Statistician Method: Surveyor, minerals: Other (comment) (bed to chair) Equipment Used: Gait belt Transfers/Ambulation Related to ADLs: +2 assist for SPT due to pt agitation and confusion. ADL Comments: Pt very agitated (but unable to receive and meds for a while per RN report). Caregiver present upon OT arrival. Pt in recliner and attempting to scoot out of chair.  Repositioned pt several times to back of chair but pt continued to scoot forward attempting to get out.  When attempting to assist pt to stand, pt resistive to transfer.  RN present at bedside and assisted OT with transfer from chair to bed. Attempting to redirect pt to busy board but  pt too agitated and continued to attempting crawling over bed rails.      OT Diagnosis: Cognitive deficits;Generalized weakness  OT Problem List: Decreased activity tolerance;Impaired balance (sitting and/or standing);Decreased cognition OT Treatment Interventions: Self-care/ADL training;DME and/or AE instruction;Therapeutic activities;Cognitive remediation/compensation;Patient/family education;Balance training   OT Goals Acute Rehab OT Goals OT Goal Formulation: With patient/family Time For Goal Achievement: 12/30/11 Potential to Achieve Goals: Good ADL Goals Pt Will Perform Eating: with supervision;Sitting, chair ADL Goal: Eating - Progress: Goal set today Pt Will Transfer to Toilet: with min assist;Ambulation;3-in-1 ADL Goal: Toilet Transfer - Progress: Goal set today Miscellaneous OT Goals Miscellaneous OT Goal #1: Pt's caregiver will be independent in assisting pt with functional transfers. OT Goal: Miscellaneous Goal #1 - Progress: Goal set today  Visit Information  Last OT Received On: 12/23/11 Assistance Needed: +2 (pt fluctuates throughout day from +1- +2 due to agitation)    Subjective Data  Subjective: "Call the police!!!" "Sssh, I'm praying."   Prior Functioning     Home Living Lives With: Daughter;Other (Comment) (hired caregiver) Available Help at Discharge: Personal care attendant;Available 24 hours/day Type of Home: House Home Access: Level entry Home Layout: One level Bathroom Shower/Tub:  (sponge bathes) Home Adaptive Equipment: None Prior Function Level of Independence: Needs assistance Needs Assistance: Bathing;Dressing;Grooming Bath: Maximal Dressing: Maximal Grooming: Maximal Gait Assistance: 1 person assist Transfer Assistance: 1 person assist Comments: Caregiver reports pt fluctuates day to day with level of assist needed for ADLs.  Has recently been feeding herself with LUE although pt is typically right handed. Communication Communication: No  difficulties Dominant Hand: Right         Vision/Perception     Cognition  Overall Cognitive Status: History of cognitive impairments - at baseline Arousal/Alertness: Awake/alert Orientation Level: Disoriented to;Place;Time;Situation Behavior During Session: Agitated Cognition - Other Comments: Pt very agitated and attempting to get out of chair and bed.    Extremity/Trunk Assessment Right Upper Extremity Assessment RUE ROM/Strength/Tone: [facility] for tasks assessed;Unable to fully assess;Due to impaired cognition (Frequently grabbing therapist arm and does not let go) Left Upper Extremity Assessment LUE ROM/Strength/Tone: [facility] for tasks assessed;Unable to fully assess;Due to impaired cognition (Frequently grabbing therapist arm and does not let go)     Mobility Bed Mobility Bed Mobility: Sit to Supine Sit to Supine: 1: +2 Total assist;HOB flat Sit to Supine: Patient Percentage: 0% Transfers Transfers: Sit to Stand;Stand to Sit Sit to Stand: 1: +2 Total assist;From chair/3-in-1 Sit to Stand: Patient Percentage: 10% Stand to Sit: 1: +2 Total assist;To bed Stand to Sit: Patient Percentage: 10% Details for Transfer Assistance: Pt resistant to transfer. Assist to block bil knees from buckling and to faciliate hip extension.     Shoulder Instructions     Exercise     Balance     End of Session OT - End of Session Equipment Utilized During Treatment: Gait belt Activity Tolerance: Treatment limited secondary to agitation Patient left: in bed;with call bell/phone within reach;with nursing in room;Other (comment) (with caregiver) Nurse Communication: Mobility status (agitation)  GO Functional Assessment Tool Used: clinical judgement Functional Limitation: Self care Self Care Current Status (J4782): At least 80 percent but less than 100 percent impaired, limited or restricted Self Care Goal Status (N5621): At least 20 percent but less than 40 percent impaired, limited or restricted   12/23/2011 Cipriano Mile OTR/L Pager [phone] Office [phone]  Cipriano Mile 12/23/2011, 3:38 PM

## 2011-12-24 LAB — BASIC METABOLIC PANEL
CO2: 26 mEq/L (ref 19–32)
Calcium: 9 mg/dL (ref 8.4–10.5)
Creatinine, Ser: 0.57 mg/dL (ref 0.50–1.10)
GFR calc non Af Amer: 86 mL/min — ABNORMAL LOW (ref >90)
Sodium: 142 mEq/L (ref 135–145)

## 2011-12-24 LAB — URINE CULTURE: Colony Count: >=100000

## 2011-12-24 MED ORDER — CIPROFLOXACIN HCL 500 MG PO TABS: 500.0000 mg | ORAL_TABLET | Freq: Two times a day (BID) | ORAL | Status: DC

## 2011-12-24 MED ORDER — ENOXAPARIN SODIUM 40 MG/0.4ML ~~LOC~~ SOLN
40.0000 mg | Freq: Every day | SUBCUTANEOUS | Status: DC
  Filled 2011-12-24: qty 0.4

## 2011-12-24 MED ORDER — CIPROFLOXACIN HCL 500 MG PO TABS
500.0000 mg | ORAL_TABLET | Freq: Every day | ORAL | Status: DC
  Filled 2011-12-24: qty 1

## 2011-12-24 NOTE — Progress Notes (Signed)
NURSING PROGRESS NOTE  [name] [id number] Discharge Data: 12/24/2011 2:36 PM Attending Provider: Maretta Bees, MD [id number],AOZHY Homero Fellers, MD     Merrilyn Puma to be D/C'd Home per MD order.  Discussed with the daughter the After Visit Summary and all questions fully answered. All IV's discontinued with no bleeding noted. All belongings returned to patient for patient to take home.   Last Vital Signs:  Blood pressure 128/73, pulse 91, temperature 97.8 F (36.6 C), temperature source Oral, resp. rate 18, height 5\' 2"  (1.575 m), weight 48.1 kg (106 lb 0.7 oz), SpO2 98.00%.  Discharge Medication List   Medication List     As of 12/24/2011  2:36 PM    TAKE these medications         acetaminophen 650 MG CR tablet   Commonly known as: TYLENOL   Take 650 mg by mouth 2 (two) times daily.      ALPRAZolam 0.25 MG tablet   Commonly known as: XANAX   Take 0.25 mg by mouth 2 (two) times daily.      aspirin 81 MG tablet   Take 81 mg by mouth daily.      calcium-vitamin D 500 MG tablet   Take 1 tablet by mouth 2 (two) times daily.      Cholecalciferol 1000 UNITS capsule   Take 1,000 Units by mouth daily.      ciprofloxacin 500 MG tablet   Commonly known as: CIPRO   Take 1 tablet (500 mg total) by mouth 2 (two) times daily.      donepezil 10 MG tablet   Commonly known as: ARICEPT   Take 10 mg by mouth at bedtime.      Fish Oil 1000 MG Caps   Take 1 capsule by mouth daily.      levothyroxine 100 MCG tablet   Commonly known as: SYNTHROID, LEVOTHROID   Take 100 mcg by mouth daily.      multivitamin tablet   Take 1 tablet by mouth daily.      oxybutynin 10 MG 24 hr tablet   Commonly known as: DITROPAN-XL   Take 10 mg by mouth every evening. This to be taken in evening (takes XL15mg  in AM )      oxybutynin 15 MG 24 hr tablet   Commonly known as: DITROPAN XL   Take 15 mg by mouth every morning.      sertraline 50 MG tablet   Commonly known as: ZOLOFT   Take 50 mg by mouth every evening.      simvastatin 10 MG tablet   Commonly known as: ZOCOR   Take 10 mg by mouth every evening.        Fredderick Swanger, Elmarie Mainland, RN

## 2011-12-24 NOTE — Discharge Summary (Signed)
PATIENT DETAILS Name: [name] Age: 78 y.o. Sex: female Date of Birth: 1930-03-01 MRN: [id number]. Admit Date: 12/22/2011 Admitting Physician: Dorothea Ogle, MD [id number] Homero Fellers, MD  Recommendations for Outpatient Follow-up:  1. Follow up on urine culture done 12/22/11  PRIMARY DISCHARGE DIAGNOSIS:  Principal Problem:  *Altered mental status Active Problems:  HYPOTHYROIDISM  OVERACTIVE BLADDER  Dementia  Generalized weakness  Hypokalemia  CKD (chronic kidney disease), stage II      PAST MEDICAL HISTORY: Past Medical History  Diagnosis Date  . DIVERTICULOSIS, COLON 07/13/2007  . GERD 09/25/2008  . HYPERTENSION 12/09/2008  . HYPOTHYROIDISM 07/17/2006  . OSTEOARTHRITIS 11/16/2006  . OSTEOPOROSIS 07/17/2006  . Other and unspecified hyperlipidemia 11/19/2007  . OVERACTIVE BLADDER 06/08/2009  . RHUS DERMATITIS 08/10/2007  . Unspecified hearing loss 12/09/2008  . Unspecified urinary incontinence 09/02/2009  . Glaucoma     to left eye    DISCHARGE MEDICATIONS:   Medication List     As of 12/24/2011 12:16 PM    TAKE these medications         acetaminophen 650 MG CR tablet   Commonly known as: TYLENOL   Take 650 mg by mouth 2 (two) times daily.      ALPRAZolam 0.25 MG tablet   Commonly known as: XANAX   Take 0.25 mg by mouth 2 (two) times daily.      aspirin 81 MG tablet   Take 81 mg by mouth daily.      calcium-vitamin D 500 MG tablet   Take 1 tablet by mouth 2 (two) times daily.      Cholecalciferol 1000 UNITS capsule   Take 1,000 Units by mouth daily.      ciprofloxacin 500 MG tablet   Commonly known as: CIPRO   Take 1 tablet (500 mg total) by mouth 2 (two) times daily.      donepezil 10 MG tablet   Commonly known as: ARICEPT   Take 10 mg by mouth at bedtime.      Fish Oil 1000 MG Caps   Take 1 capsule by mouth daily.      levothyroxine 100 MCG tablet   Commonly known as: SYNTHROID, LEVOTHROID   Take 100 mcg by mouth daily.       multivitamin tablet   Take 1 tablet by mouth daily.      oxybutynin 10 MG 24 hr tablet   Commonly known as: DITROPAN-XL   Take 10 mg by mouth every evening. This to be taken in evening (takes XL15mg  in AM )      oxybutynin 15 MG 24 hr tablet   Commonly known as: DITROPAN XL   Take 15 mg by mouth every morning.      sertraline 50 MG tablet   Commonly known as: ZOLOFT   Take 50 mg by mouth every evening.      simvastatin 10 MG tablet   Commonly known as: ZOCOR   Take 10 mg by mouth every evening.         BRIEF HPI:  See H&P, Labs, Consult and Test reports for all details in brief,Pt is 78 yo female with progressive dementia who was brought in by family due to main concern of progressive decline in functional status associated with confusion, difficulty with speech and difficulty with ambulating (unsteady gait).  CONSULTATIONS:   None  PERTINENT RADIOLOGIC STUDIES: Dg Chest 2 View  12/22/2011  *RADIOLOGY REPORT*  Clinical Data: Possible TIA.  Rales.  CHEST - 2 VIEW  Comparison: 06/13/2006  Findings: Borderline cardiomegaly is stable.  Mediastinal hilar contours are stable.  Lung volumes are low, with streaky bibasilar atelectasis.  No airspace disease or pleural effusion is identified.  No acute bony abnormality.  IMPRESSION: Bibasilar atelectasis.  Stable borderline cardiomegaly.   Original Report Authenticated By: Britta Mccreedy, M.D.    Ct Head Wo Contrast  12/22/2011  *RADIOLOGY REPORT*  Clinical Data:  Stroke symptoms.  History of dementia.  Decline in mental status.  Worsening cognitive dysfunction for the last seven days.  Unsteady on her feet.  CT HEAD WITHOUT CONTRAST  Technique:  Contiguous axial images were obtained from the base of the skull through the vertex without contrast.  Comparison: 08/13/2008  Findings: There is significant central and cortical atrophy. Periventricular white matter changes are consistent with small vessel disease. There is no evidence for hemorrhage,  mass lesion, or acute infarction.  Bone windows show no acute abnormality.  There are postsurgical changes associated with the left globe.  IMPRESSION:  1.  Significant atrophy and small vessel disease. 2. No evidence for acute intracranial abnormality.   Original Report Authenticated By: Norva Pavlov, M.D.    Mr Brain Wo Contrast  12/22/2011  *RADIOLOGY REPORT*  Clinical Data: Functional decline with confusion and difficulty with speech.  Difficulty ambulating with unsteady gait.  MRI HEAD WITHOUT CONTRAST  Technique:  Multiplanar, multiecho pulse sequences of the brain and surrounding structures were obtained according to standard protocol without intravenous contrast.  Comparison: 12/22/2011 and 08/13/2008 CT.  No comparison MR.  Findings: The patient had difficulty holding still and completing all sequences.  Images are motion degraded.  No acute infarct.  No intracranial hemorrhage.  Mild small vessel disease type changes.  No intracranial mass lesion detected on this unenhanced exam.  Global moderate atrophy.  Ventricular prominence without change compared to 2010 and may be related to central atrophy however, difficult to completely exclude mild component of  hydrocephalus.  Major intracranial vascular structures are patent with small vertebral arteries and basilar artery.  Minimal ethmoid sinus air cell mucosal thickening.  IMPRESSION: The patient had difficulty holding still and completing all sequences.  Images are motion degraded.  No acute infarct.  Mild small vessel disease type changes.  Global moderate atrophy.  Ventricular prominence without change compared to 2010 and may be related to central atrophy however, difficult to completely exclude mild component of  hydrocephalus.   Original Report Authenticated By: Lacy Duverney, M.D.      PERTINENT LAB RESULTS: CBC:  Basename 12/23/11 0640 12/22/11 1635  WBC 4.7 6.1  HGB 12.3 12.3  HCT 37.9 37.4  PLT 209 209   CMET CMP     Component  Value Date/Time   NA 142 12/24/2011 0535   K 3.5 12/24/2011 0535   CL 105 12/24/2011 0535   CO2 26 12/24/2011 0535   GLUCOSE 101* 12/24/2011 0535   BUN 9 12/24/2011 0535   CREATININE 0.57 12/24/2011 0535   CALCIUM 9.0 12/24/2011 0535   PROT 7.1 12/22/2011 1635   ALBUMIN 3.4* 12/22/2011 1635   AST 21 12/22/2011 1635   ALT 14 12/22/2011 1635   ALKPHOS 76 12/22/2011 1635   BILITOT 0.2* 12/22/2011 1635   GFRNONAA 86* 12/24/2011 0535   GFRAA >90 12/24/2011 0535    GFR Estimated Creatinine Clearance: 43.3 ml/min (by C-G formula based on Cr of 0.57). No results found for this basename: LIPASE:2,AMYLASE:2 in the last 72 hours  Basename 12/22/11 1635  CKTOTAL --  CKMB --  CKMBINDEX --  TROPONINI <0.30   No components found with this basename: POCBNP:3 No results found for this basename: DDIMER:2 in the last 72 hours No results found for this basename: HGBA1C:2 in the last 72 hours No results found for this basename: CHOL:2,HDL:2,LDLCALC:2,TRIG:2,CHOLHDL:2,LDLDIRECT:2 in the last 72 hours No results found for this basename: TSH,T4TOTAL,FREET3,T3FREE,THYROIDAB in the last 72 hours No results found for this basename: VITAMINB12:2,FOLATE:2,FERRITIN:2,TIBC:2,IRON:2,RETICCTPCT:2 in the last 72 hours Coags: No results found for this basename: PT:2,INR:2 in the last 72 hours Microbiology: Recent Results (from the past 240 hour(s))  URINE CULTURE     Status: Normal (Preliminary result)   Collection Time   12/22/11  6:51 PM      Component Value Range Status Comment   Specimen Description URINE, CATHETERIZED   Final    Special Requests NONE   Final    Culture  Setup Time 12/22/2011 21:37   Final    Colony Count PENDING   Incomplete    Culture Culture reincubated for better growth   Final    Report Status PENDING   Incomplete      BRIEF HOSPITAL COURSE:   Principal Problem: *Altered mental status -multifactorial-likely from progression of underlying vascular dementia and also from ?UTI -almost back  to baseline -MRI Brain-no acute infarct-a lot of chronic changes -seen by PT-ambulated 150 feet  -has 24/7 care at home-will discharge-as AMS/Gait issue are all chronic and most likely from underlying Delirium from Dementia-probably exacerbated by UTI  Active Problems: Hypernatremia  -resolved with IVF hydration  Urinary tract infection  -Afebrile without leukocytosis -was on Rocephin during this hospitalization-urine culture still pending-will transition to Cipro on discharge. Will follow urine culture to see if positive and if sensitive to Cipro  Urinary tract infection  -Afebrile without leukocytosis  -Continue with Rocephin and await urine culture results   HYPOTHYROIDISM  -Continue with levothyroxine   OVERACTIVE BLADDER  -Continue with Ditropan  -This is stable and is a chronic issue   Dementia with delirium  - Per family, patient has had progressive worsening of her dementia and gets very tedious at times. She now has 24/7 care at home. I did explain to her daughter the natural progression of the disease does involve periodic flareups with worsening confusion/agitation. Also did explain that worsening gait, dysphagia, incontinence or part of this disease.   Generalized weakness  -Secondary to failure to thrive and advanced dementia   TODAY-DAY OF DISCHARGE:  Subjective:   Micah Flesher today remains pleasantly confused, she did get a dose of IV Ativan last night-and is slightly drowsy, but awake and confused  Objective:   Blood pressure 144/68, pulse 86, temperature 97.9 F (36.6 C), temperature source Oral, resp. rate 18, height 5\' 2"  (1.575 m), weight 48.1 kg (106 lb 0.7 oz), SpO2 98.00%.  Intake/Output Summary (Last 24 hours) at 12/24/11 1216 Last data filed at 12/24/11 0957  Gross per 24 hour  Intake 1753.75 ml  Output      0 ml  Net 1753.75 ml    Exam Awake Alert, Oriented *3, No new F.N deficits, Normal affect [place].AT,PERRAL Supple Neck,No JVD, No  cervical lymphadenopathy appriciated.  Symmetrical Chest wall movement, Good air movement bilaterally, CTAB RRR,No Gallops,Rubs or new Murmurs, No Parasternal Heave +ve B.Sounds, Abd Soft, Non tender, No organomegaly appriciated, No rebound -guarding or rigidity. No Cyanosis, Clubbing or edema, No new Rash or bruise  DISCHARGE CONDITION: Stable  DISPOSITION: HOME  DISCHARGE INSTRUCTIONS:    Activity:  As tolerated with Full  fall precautions use walker/cane & assistance as needed  Diet recommendation: Regular Diet  CODE STATUS -DNR-Family did request that Renette Butters DNR form be given to them on discharge. I have filled it out and have asked RN to give to family on D/C  Total Time spent on discharge equals 45 minutes.  SignedJeoffrey Massed 12/24/2011 12:16 PM

## 2011-12-24 NOTE — Progress Notes (Signed)
Patient up to chair

## 2012-01-13 ENCOUNTER — Other Ambulatory Visit: Payer: Self-pay | Admitting: Internal Medicine

## 2012-01-23 ENCOUNTER — Ambulatory Visit (INDEPENDENT_AMBULATORY_CARE_PROVIDER_SITE_OTHER): Payer: Medicare Other | Admitting: Internal Medicine

## 2012-01-23 DIAGNOSIS — N318 Other neuromuscular dysfunction of bladder: Secondary | ICD-10-CM

## 2012-01-23 DIAGNOSIS — F0390 Unspecified dementia without behavioral disturbance: Secondary | ICD-10-CM

## 2012-01-23 NOTE — Progress Notes (Signed)
  Subjective:    Patient ID: [name], female    DOB: 07/28/1930, 79 y.o.   MRN: [id number]  HPI  family conference held with [name]'s daughter and son-in-law concerning behavioral health issues. She is on Aricept for a moderately severe dementia. She has significant overactive bladder symptoms and benefits from oxybutynin.  For the past year and a half the patient has required 24 7 in home assistance but funds will  be depleted within the next 2 months. She was hospitalized recently for acute mental status changes in the setting of a urinary tract infection.  The family is considering placement but the patient is so aggressive and belligerent that they fear that no facility will accept the patient. The patient is also quite demanding and requires 24-hour care.  The family is here today to discuss options    Review of Systems     Objective:   Physical Exam        Assessment & Plan:   Progressive dementia.  We'll set up a consultation with [name];  the family is aware to take the patient to the hospital for evaluation  If  she becomes a threat to herself or others

## 2012-01-23 NOTE — Patient Instructions (Signed)
Consultation with Dr. Donell Beers as discussed

## 2012-02-01 ENCOUNTER — Encounter (HOSPITAL_COMMUNITY): Payer: Self-pay | Admitting: *Deleted

## 2012-02-01 ENCOUNTER — Emergency Department (HOSPITAL_COMMUNITY)
Admission: EM | Admit: 2012-02-01 | Discharge: 2012-02-01 | Disposition: A | Discharge status: Home / Self Care | Payer: Medicare Other | Attending: Emergency Medicine | Admitting: Emergency Medicine

## 2012-02-01 ENCOUNTER — Emergency Department (HOSPITAL_COMMUNITY): Payer: Medicare Other

## 2012-02-01 DIAGNOSIS — Z8719 Personal history of other diseases of the digestive system: Secondary | ICD-10-CM | POA: Insufficient documentation

## 2012-02-01 DIAGNOSIS — Z87891 Personal history of nicotine dependence: Secondary | ICD-10-CM | POA: Insufficient documentation

## 2012-02-01 DIAGNOSIS — Z87448 Personal history of other diseases of urinary system: Secondary | ICD-10-CM | POA: Insufficient documentation

## 2012-02-01 DIAGNOSIS — S0990XA Unspecified injury of head, initial encounter: Secondary | ICD-10-CM | POA: Insufficient documentation

## 2012-02-01 DIAGNOSIS — E039 Hypothyroidism, unspecified: Secondary | ICD-10-CM | POA: Insufficient documentation

## 2012-02-01 DIAGNOSIS — Y9289 Other specified places as the place of occurrence of the external cause: Secondary | ICD-10-CM | POA: Insufficient documentation

## 2012-02-01 DIAGNOSIS — S0083XA Contusion of other part of head, initial encounter: Secondary | ICD-10-CM | POA: Insufficient documentation

## 2012-02-01 DIAGNOSIS — S5000XA Contusion of unspecified elbow, initial encounter: Secondary | ICD-10-CM | POA: Insufficient documentation

## 2012-02-01 DIAGNOSIS — H409 Unspecified glaucoma: Secondary | ICD-10-CM | POA: Insufficient documentation

## 2012-02-01 DIAGNOSIS — Y9389 Activity, other specified: Secondary | ICD-10-CM | POA: Insufficient documentation

## 2012-02-01 DIAGNOSIS — W1809XA Striking against other object with subsequent fall, initial encounter: Secondary | ICD-10-CM | POA: Insufficient documentation

## 2012-02-01 DIAGNOSIS — S7000XA Contusion of unspecified hip, initial encounter: Secondary | ICD-10-CM | POA: Insufficient documentation

## 2012-02-01 DIAGNOSIS — K219 Gastro-esophageal reflux disease without esophagitis: Secondary | ICD-10-CM | POA: Insufficient documentation

## 2012-02-01 DIAGNOSIS — Z872 Personal history of diseases of the skin and subcutaneous tissue: Secondary | ICD-10-CM | POA: Insufficient documentation

## 2012-02-01 DIAGNOSIS — Z79899 Other long term (current) drug therapy: Secondary | ICD-10-CM | POA: Insufficient documentation

## 2012-02-01 DIAGNOSIS — S0003XA Contusion of scalp, initial encounter: Secondary | ICD-10-CM | POA: Insufficient documentation

## 2012-02-01 DIAGNOSIS — W19XXXA Unspecified fall, initial encounter: Secondary | ICD-10-CM

## 2012-02-01 DIAGNOSIS — E785 Hyperlipidemia, unspecified: Secondary | ICD-10-CM | POA: Insufficient documentation

## 2012-02-01 DIAGNOSIS — F0390 Unspecified dementia without behavioral disturbance: Secondary | ICD-10-CM | POA: Insufficient documentation

## 2012-02-01 DIAGNOSIS — Z8739 Personal history of other diseases of the musculoskeletal system and connective tissue: Secondary | ICD-10-CM | POA: Insufficient documentation

## 2012-02-01 DIAGNOSIS — H9190 Unspecified hearing loss, unspecified ear: Secondary | ICD-10-CM | POA: Insufficient documentation

## 2012-02-01 DIAGNOSIS — Z7982 Long term (current) use of aspirin: Secondary | ICD-10-CM | POA: Insufficient documentation

## 2012-02-01 HISTORY — DX: Unspecified dementia, unspecified severity, without behavioral disturbance, psychotic disturbance, mood disturbance, and anxiety: F03.90

## 2012-02-01 LAB — URINALYSIS, ROUTINE W REFLEX MICROSCOPIC
Bilirubin Urine: NEGATIVE
Glucose, UA: NEGATIVE mg/dL
Hgb urine dipstick: NEGATIVE
Ketones, ur: NEGATIVE mg/dL
Protein, ur: NEGATIVE mg/dL

## 2012-02-01 LAB — URINE MICROSCOPIC-ADD ON

## 2012-02-01 MED ORDER — ACETAMINOPHEN 325 MG PO TABS
325.0000 mg | ORAL_TABLET | Freq: Once | ORAL | Status: AC
  Administered 2012-02-01: 325 mg via ORAL
  Filled 2012-02-01: qty 1

## 2012-02-01 NOTE — ED Notes (Signed)
Pt was eating and turned to go another way and when she turned she fell down.  Her family reports gait and balance has been off.  Pt hit right head on cabinet.  NO LOC.  No blood thinners.  Pt has dementia but is talking.  Family at bedside.. Pt has large hematoma to right eye

## 2012-02-01 NOTE — ED Notes (Signed)
Patient was able to weight bear and ambulate at baseline without any difficulty.

## 2012-02-01 NOTE — ED Provider Notes (Signed)
History     CSN: [id number]  Arrival date & time 02/01/12  1723   First MD Initiated Contact with Patient 02/01/12 1900      Chief Complaint  Patient presents with  . Fall    (Consider location/radiation/quality/duration/timing/severity/associated sxs/prior treatment) HPI Level 5 caveat due to dementia Pt with history of dementia and balance issues brought to the ED by family who report she had a fall earlier today in her kitchen, hitting her head on the cabinet. She did not have LOC. She has had similar balance problems in the past with UTI.   Past Medical History  Diagnosis Date  . DIVERTICULOSIS, COLON 07/13/2007  . GERD 09/25/2008  . HYPERTENSION 12/09/2008  . HYPOTHYROIDISM 07/17/2006  . OSTEOARTHRITIS 11/16/2006  . OSTEOPOROSIS 07/17/2006  . Other and unspecified hyperlipidemia 11/19/2007  . OVERACTIVE BLADDER 06/08/2009  . RHUS DERMATITIS 08/10/2007  . Unspecified hearing loss 12/09/2008  . Unspecified urinary incontinence 09/02/2009  . Glaucoma     to left eye  . Dementia     Past Surgical History  Procedure Date  . Abdominal hysterectomy   . Tonsillectomy   . Knee arthroscopy     right  . Corneal transplant     left  . Total knee arthroplasty     History reviewed. No pertinent family history.  History  Substance Use Topics  . Smoking status: Former Smoker    Quit date: 01/18/1948  . Smokeless tobacco: Never Used  . Alcohol Use: No    OB History    Grav Para Term Preterm Abortions TAB SAB Ect Mult Living                  Review of Systems Unable to assess due to mental status.   Allergies  Latex and Codeine  Home Medications   Current Outpatient Rx  Name  Route  Sig  Dispense  Refill  . ACETAMINOPHEN ER 650 MG PO TBCR   Oral   Take 650 mg by mouth 2 (two) times daily.         Marland Kitchen ALPRAZOLAM 0.25 MG PO TABS   Oral   Take 0.25 mg by mouth 2 (two) times daily.         . ASPIRIN 81 MG PO TABS   Oral   Take 81 mg by mouth daily.          Marland Kitchen CALCIUM-VITAMIN D 500 MG PO TABS   Oral   Take 1 tablet by mouth 2 (two) times daily.           . CHOLECALCIFEROL 1000 UNITS PO CAPS   Oral   Take 1,000 Units by mouth daily.           Marland Kitchen CIPROFLOXACIN HCL 500 MG PO TABS   Oral   Take 1 tablet (500 mg total) by mouth 2 (two) times daily.   10 tablet   0   . DONEPEZIL HCL 10 MG PO TABS   Oral   Take 10 mg by mouth at bedtime.         Marland Kitchen LEVOTHYROXINE SODIUM 100 MCG PO TABS   Oral   Take 100 mcg by mouth daily.         Marland Kitchen LEVOTHYROXINE SODIUM 100 MCG PO TABS      TAKE ONE TABLET BY MOUTH DAILY.   90 tablet   2   . ONE-DAILY MULTI VITAMINS PO TABS   Oral   Take 1 tablet by mouth daily.           Marland Kitchen  FISH OIL 1000 MG PO CAPS   Oral   Take 1 capsule by mouth daily.           . OXYBUTYNIN CHLORIDE ER 15 MG PO TB24   Oral   Take 15 mg by mouth every morning.         . OXYBUTYNIN CHLORIDE ER 10 MG PO TB24   Oral   Take 10 mg by mouth every evening. This to be taken in evening (takes XL15mg  in AM )         . SERTRALINE HCL 50 MG PO TABS   Oral   Take 50 mg by mouth every evening.         Marland Kitchen SIMVASTATIN 10 MG PO TABS   Oral   Take 10 mg by mouth every evening.           BP 138/60  Pulse 77  Temp 98.5 F (36.9 C) (Oral)  Resp 14  SpO2 100%  Physical Exam  Nursing note and vitals reviewed. Constitutional: She appears well-developed and well-nourished.  HENT:  Head: Normocephalic.       Large hematoma to R forehead and periorbital region  Eyes: EOM are normal.  Neck: Normal range of motion. Neck supple.  Cardiovascular: Normal rate, normal heart sounds and intact distal pulses.   Pulmonary/Chest: Effort normal and breath sounds normal.  Abdominal: Bowel sounds are normal. She exhibits no distension. There is no tenderness.  Musculoskeletal: Normal range of motion. She exhibits tenderness (tenderness and ecchymosis to R elbow and R hip; no deformity). She exhibits no edema.   Neurological: She is alert. She has normal strength. No cranial nerve deficit or sensory deficit.  Skin: Skin is warm and dry. No rash noted.  Psychiatric: She has a normal mood and affect.    ED Course  Procedures (including critical care time)   Labs Reviewed  URINALYSIS, ROUTINE W REFLEX MICROSCOPIC   No results found.   No diagnosis found.    MDM  Will send for imaging. UA collected as well. Family states that balance problems are an ongoing problem for her with no acute change today. They are comfortable with her at home if imaging is negative.         Aseneth Hack B. Bernette Mayers, MD 02/02/12 1054

## 2012-02-01 NOTE — ED Provider Notes (Signed)
8:52 PM  Pt care resumed from Dr. Bernette Mayers. Pt is a 79 yo w dementia presenting to ER s/p fall. Plan per previous provider is dispo pending labs and imaging. Results reviewed below without any acute concerning findings. Cervical collar removed- No bony stepoffs palpated on cervical spine & normal pain free ROM of cervical spine. Pt ambulated in ED and per family is walking at baseline with out difficulty bearing weight. Imaging and urine results discussed with pts family who is agreeable with discharge and verbalizes they are able to care for pt at home. Return precautions discussed.  At this time there does not appear to be any evidence of an acute emergency medical condition and the patient appears stable for discharge with appropriate outpatient follow up.Diagnosis was discussed with patient who verbalizes understanding and is agreeable to discharge.  Urinalysis    Component Value Date/Time   COLORURINE YELLOW 02/01/2012 1933   APPEARANCEUR CLEAR 02/01/2012 1933   LABSPEC 1.017 02/01/2012 1933   PHURINE 6.0 02/01/2012 1933   GLUCOSEU NEGATIVE 02/01/2012 1933   HGBUR NEGATIVE 02/01/2012 1933   HGBUR trace-lysed 09/02/2009 1434   BILIRUBINUR NEGATIVE 02/01/2012 1933   BILIRUBINUR n 08/19/2011 1455   KETONESUR NEGATIVE 02/01/2012 1933   PROTEINUR NEGATIVE 02/01/2012 1933   UROBILINOGEN 0.2 02/01/2012 1933   UROBILINOGEN 0.2 08/19/2011 1455   NITRITE NEGATIVE 02/01/2012 1933   NITRITE n 08/19/2011 1455   LEUKOCYTESUR TRACE* 02/01/2012 1933    RIGHT ELBOW - COMPLETE 3+ VIEW Comparison: None. Findings: Decreased bony mineralization. The elbow is located. No evidence of joint effusion. No acute fracture or discrete soft tissue swelling is identified. IMPRESSION: No acute bony abnormality or joint effusion.  RIGHT HIP - COMPLETE 2+ VIEW Comparison: CT abdomen pelvis 07/16/2007 Findings: Base appears slightly demineralized. The right femoral head is located in the right acetabulum. No acute fracture of  the proximal right femur or bony pelvis is identified. Bowel gas and stool project over the iliac bones and sacrum and the left pubic bones. IMPRESSION: No acute bony abnormality identified. If there is clinical concern for radiographically occult fracture, pelvic MRI or CT could be considered.  CT HEAD Findings: Stable cerebral atrophy and mild periventricular chronic white matter ischemic changes. Ventricles remain mildly prominent, likely related to cerebral atrophy. A mild degree of hydrocephalus cannot be excluded, as described on recent brain MRI. The ventricular size is stable. No negative for hemorrhage, mass effect, mass lesion, or evidence of acute cortically based infarction. There is a right pre-orbital and right frontal scalp hematoma. The right globe and lens are intact. There postsurgical changes of the lateral left orbit, stable. The skull is intact. IMPRESSION: 1. Right frontal scalp/right periorbital hematoma. 2. No acute intracranial abnormality. 3. Chronic atrophy and chronic microvascular ischemic changes. Stable mild ventriculomegaly.  CT MAXILLOFACIAL Findings: There is a right frontal and right pre-orbital hematoma. The facial bones are intact. Specifically, there is no evidence of a right frontal bone or right orbital fracture. The mandibular condyles are located. There is streak artifact from dental hardware bilaterally. The paranasal sinuses are clear. IMPRESSION: 1. Right frontal and right preorbital hematoma. 2. Negative for acute fracture. 3. Clear sinuses.   CT CERVICAL SPINE Findings: Cervical spine is imaged from the skull base through the T2 vertebral body. There is 2 mm anterolisthesis of C7 on T1, likely related to facet joint degenerative change. The remainder of the cervical spine vertebral bodies are normally aligned. There is disc height narrowing at C3-4, C4-5, C5-6, and  C6-7. No acute fracture is identified. The prevertebral soft  tissue contour is normal. The spinal canal is patent. Lung apices are clear. IMPRESSION: 1. No acute bony abnormality. 2. 2 mm anterolisthesis of C7 on T1 is likely due to facet joint degenerative change at this level.           Jaci Carrel, [place] 02/01/12 2126

## 2012-02-01 NOTE — ED Notes (Signed)
Patient was placed in c-collar upon arrival to room.

## 2012-02-02 NOTE — ED Provider Notes (Signed)
Medical screening examination/treatment/procedure(s) were conducted as a shared visit with non-physician practitioner(s) and myself.  I personally evaluated the patient during the encounter   Charles B. Bernette Mayers, MD 02/02/12 1055

## 2012-02-08 ENCOUNTER — Other Ambulatory Visit: Payer: Self-pay | Admitting: Internal Medicine

## 2012-05-04 ENCOUNTER — Emergency Department (HOSPITAL_COMMUNITY): Payer: Medicare Other

## 2012-05-04 ENCOUNTER — Inpatient Hospital Stay (HOSPITAL_COMMUNITY)
Admission: EM | Admit: 2012-05-04 | Discharge: 2012-05-11 | DRG: 391 | Disposition: A | Discharge status: Skilled Nursing Facility | Payer: Medicare Other | Attending: Internal Medicine | Admitting: Internal Medicine

## 2012-05-04 ENCOUNTER — Encounter (HOSPITAL_COMMUNITY): Payer: Self-pay | Admitting: Emergency Medicine

## 2012-05-04 DIAGNOSIS — R32 Unspecified urinary incontinence: Secondary | ICD-10-CM

## 2012-05-04 DIAGNOSIS — M1990 Unspecified osteoarthritis, unspecified site: Secondary | ICD-10-CM | POA: Diagnosis present

## 2012-05-04 DIAGNOSIS — R1310 Dysphagia, unspecified: Secondary | ICD-10-CM | POA: Diagnosis present

## 2012-05-04 DIAGNOSIS — H9190 Unspecified hearing loss, unspecified ear: Secondary | ICD-10-CM | POA: Diagnosis present

## 2012-05-04 DIAGNOSIS — E785 Hyperlipidemia, unspecified: Secondary | ICD-10-CM | POA: Diagnosis present

## 2012-05-04 DIAGNOSIS — F0150 Vascular dementia without behavioral disturbance: Secondary | ICD-10-CM | POA: Diagnosis present

## 2012-05-04 DIAGNOSIS — N183 Chronic kidney disease, stage 3 unspecified: Secondary | ICD-10-CM | POA: Diagnosis present

## 2012-05-04 DIAGNOSIS — I129 Hypertensive chronic kidney disease with stage 1 through stage 4 chronic kidney disease, or unspecified chronic kidney disease: Secondary | ICD-10-CM | POA: Diagnosis present

## 2012-05-04 DIAGNOSIS — Z87891 Personal history of nicotine dependence: Secondary | ICD-10-CM

## 2012-05-04 DIAGNOSIS — R4789 Other speech disturbances: Secondary | ICD-10-CM | POA: Diagnosis present

## 2012-05-04 DIAGNOSIS — N390 Urinary tract infection, site not specified: Secondary | ICD-10-CM

## 2012-05-04 DIAGNOSIS — F411 Generalized anxiety disorder: Secondary | ICD-10-CM | POA: Diagnosis present

## 2012-05-04 DIAGNOSIS — M810 Age-related osteoporosis without current pathological fracture: Secondary | ICD-10-CM | POA: Diagnosis present

## 2012-05-04 DIAGNOSIS — J690 Pneumonitis due to inhalation of food and vomit: Secondary | ICD-10-CM | POA: Diagnosis present

## 2012-05-04 DIAGNOSIS — E039 Hypothyroidism, unspecified: Secondary | ICD-10-CM | POA: Diagnosis present

## 2012-05-04 DIAGNOSIS — E876 Hypokalemia: Secondary | ICD-10-CM

## 2012-05-04 DIAGNOSIS — F0390 Unspecified dementia without behavioral disturbance: Secondary | ICD-10-CM | POA: Diagnosis present

## 2012-05-04 DIAGNOSIS — R509 Fever, unspecified: Secondary | ICD-10-CM

## 2012-05-04 DIAGNOSIS — F919 Conduct disorder, unspecified: Secondary | ICD-10-CM | POA: Diagnosis present

## 2012-05-04 DIAGNOSIS — H543 Unqualified visual loss, both eyes: Secondary | ICD-10-CM | POA: Diagnosis present

## 2012-05-04 DIAGNOSIS — K228 Other specified diseases of esophagus: Principal | ICD-10-CM | POA: Diagnosis present

## 2012-05-04 DIAGNOSIS — Z66 Do not resuscitate: Secondary | ICD-10-CM | POA: Diagnosis not present

## 2012-05-04 DIAGNOSIS — N318 Other neuromuscular dysfunction of bladder: Secondary | ICD-10-CM

## 2012-05-04 DIAGNOSIS — H409 Unspecified glaucoma: Secondary | ICD-10-CM | POA: Diagnosis present

## 2012-05-04 DIAGNOSIS — L89109 Pressure ulcer of unspecified part of back, unspecified stage: Secondary | ICD-10-CM | POA: Diagnosis not present

## 2012-05-04 DIAGNOSIS — N182 Chronic kidney disease, stage 2 (mild): Secondary | ICD-10-CM | POA: Diagnosis present

## 2012-05-04 DIAGNOSIS — K2289 Other specified disease of esophagus: Principal | ICD-10-CM | POA: Diagnosis present

## 2012-05-04 DIAGNOSIS — R1311 Dysphagia, oral phase: Secondary | ICD-10-CM | POA: Diagnosis present

## 2012-05-04 DIAGNOSIS — L8990 Pressure ulcer of unspecified site, unspecified stage: Secondary | ICD-10-CM | POA: Diagnosis not present

## 2012-05-04 DIAGNOSIS — K5730 Diverticulosis of large intestine without perforation or abscess without bleeding: Secondary | ICD-10-CM

## 2012-05-04 DIAGNOSIS — I10 Essential (primary) hypertension: Secondary | ICD-10-CM

## 2012-05-04 DIAGNOSIS — L255 Unspecified contact dermatitis due to plants, except food: Secondary | ICD-10-CM

## 2012-05-04 DIAGNOSIS — R0989 Other specified symptoms and signs involving the circulatory and respiratory systems: Secondary | ICD-10-CM | POA: Diagnosis present

## 2012-05-04 DIAGNOSIS — Z515 Encounter for palliative care: Secondary | ICD-10-CM

## 2012-05-04 DIAGNOSIS — Z993 Dependence on wheelchair: Secondary | ICD-10-CM | POA: Diagnosis present

## 2012-05-04 DIAGNOSIS — R4182 Altered mental status, unspecified: Secondary | ICD-10-CM

## 2012-05-04 DIAGNOSIS — R5381 Other malaise: Secondary | ICD-10-CM | POA: Diagnosis present

## 2012-05-04 DIAGNOSIS — R531 Weakness: Secondary | ICD-10-CM

## 2012-05-04 DIAGNOSIS — I672 Cerebral atherosclerosis: Secondary | ICD-10-CM | POA: Diagnosis present

## 2012-05-04 DIAGNOSIS — Z96659 Presence of unspecified artificial knee joint: Secondary | ICD-10-CM

## 2012-05-04 DIAGNOSIS — K219 Gastro-esophageal reflux disease without esophagitis: Secondary | ICD-10-CM | POA: Diagnosis present

## 2012-05-04 LAB — URINE MICROSCOPIC-ADD ON

## 2012-05-04 LAB — BASIC METABOLIC PANEL
BUN: 16 mg/dL (ref 6–23)
Chloride: 105 mEq/L (ref 96–112)
GFR calc Af Amer: >90 mL/min (ref >90)
Potassium: 3.7 mEq/L (ref 3.5–5.1)
Sodium: 140 mEq/L (ref 135–145)

## 2012-05-04 LAB — CBC WITH DIFFERENTIAL/PLATELET
Basophils Absolute: 0 10*3/uL (ref 0.0–0.1)
HCT: 35.2 % — ABNORMAL LOW (ref 36.0–46.0)
Hemoglobin: 11.7 g/dL — ABNORMAL LOW (ref 12.0–15.0)
Lymphocytes Relative: 15 % (ref 12–46)
Monocytes Absolute: 0.8 10*3/uL (ref 0.1–1.0)
Neutro Abs: 6.5 10*3/uL (ref 1.7–7.7)
RDW: 12.8 % (ref 11.5–15.5)
WBC: 8.7 10*3/uL (ref 4.0–10.5)

## 2012-05-04 LAB — URINALYSIS, ROUTINE W REFLEX MICROSCOPIC
Bilirubin Urine: NEGATIVE
Leukocytes, UA: NEGATIVE
Nitrite: NEGATIVE
Specific Gravity, Urine: 1.022 (ref 1.005–1.030)
pH: 5.5 (ref 5.0–8.0)

## 2012-05-04 MED ORDER — LAMOTRIGINE 25 MG PO TABS
50.0000 mg | ORAL_TABLET | Freq: Two times a day (BID) | ORAL | Status: DC
  Administered 2012-05-04 – 2012-05-08 (×9): 50 mg via ORAL
  Filled 2012-05-04 (×11): qty 2

## 2012-05-04 MED ORDER — DONEPEZIL HCL 10 MG PO TABS
10.0000 mg | ORAL_TABLET | Freq: Every day | ORAL | Status: DC
  Administered 2012-05-04 – 2012-05-08 (×5): 10 mg via ORAL
  Filled 2012-05-04 (×6): qty 1

## 2012-05-04 MED ORDER — DEXTROSE 5 % IV SOLN
1.0000 g | Freq: Once | INTRAVENOUS | Status: DC
  Filled 2012-05-04: qty 10

## 2012-05-04 MED ORDER — ONDANSETRON HCL 4 MG/2ML IJ SOLN: 4.0000 mg | Freq: Four times a day (QID) | INTRAMUSCULAR | Status: DC | PRN

## 2012-05-04 MED ORDER — ONDANSETRON HCL 4 MG/2ML IJ SOLN: 4.0000 mg | Freq: Three times a day (TID) | INTRAMUSCULAR | Status: AC | PRN

## 2012-05-04 MED ORDER — ACETAMINOPHEN 325 MG PO TABS
650.0000 mg | ORAL_TABLET | Freq: Four times a day (QID) | ORAL | Status: DC | PRN
  Administered 2012-05-06 – 2012-05-07 (×2): 650 mg via ORAL
  Filled 2012-05-04 (×3): qty 2

## 2012-05-04 MED ORDER — CLONAZEPAM 0.5 MG PO TABS
0.2500 mg | ORAL_TABLET | Freq: Two times a day (BID) | ORAL | Status: DC
  Administered 2012-05-04 – 2012-05-11 (×13): 0.25 mg via ORAL
  Filled 2012-05-04 (×12): qty 1

## 2012-05-04 MED ORDER — SODIUM CHLORIDE 0.9 % IV SOLN
INTRAVENOUS | Status: AC
  Administered 2012-05-04 – 2012-05-05 (×2): via INTRAVENOUS

## 2012-05-04 MED ORDER — ACETAMINOPHEN 650 MG RE SUPP: 650.0000 mg | Freq: Four times a day (QID) | RECTAL | Status: DC | PRN

## 2012-05-04 MED ORDER — ONDANSETRON HCL 4 MG PO TABS: 4.0000 mg | ORAL_TABLET | Freq: Four times a day (QID) | ORAL | Status: DC | PRN

## 2012-05-04 MED ORDER — PREDNISOLONE ACETATE 1 % OP SUSP
1.0000 [drp] | Freq: Two times a day (BID) | OPHTHALMIC | Status: DC
  Administered 2012-05-04 – 2012-05-11 (×14): 1 [drp] via OPHTHALMIC
  Filled 2012-05-04: qty 1

## 2012-05-04 MED ORDER — LEVOTHYROXINE SODIUM 112 MCG PO TABS
112.0000 ug | ORAL_TABLET | Freq: Every day | ORAL | Status: DC
  Administered 2012-05-05 – 2012-05-11 (×6): 112 ug via ORAL
  Filled 2012-05-04 (×8): qty 1

## 2012-05-04 MED ORDER — OLANZAPINE 2.5 MG PO TABS
2.5000 mg | ORAL_TABLET | Freq: Two times a day (BID) | ORAL | Status: DC
  Administered 2012-05-04 – 2012-05-08 (×9): 2.5 mg via ORAL
  Filled 2012-05-04 (×11): qty 1

## 2012-05-04 MED ORDER — BESIFLOXACIN HCL 0.6 % OP SUSP: 1.0000 [drp] | Freq: Three times a day (TID) | OPHTHALMIC | Status: DC

## 2012-05-04 MED ORDER — MEMANTINE HCL 10 MG PO TABS
10.0000 mg | ORAL_TABLET | Freq: Every day | ORAL | Status: DC
  Administered 2012-05-04 – 2012-05-08 (×5): 10 mg via ORAL
  Filled 2012-05-04 (×6): qty 1

## 2012-05-04 NOTE — ED Notes (Signed)
Family at bedside. 

## 2012-05-04 NOTE — Consult Note (Signed)
Patient [id number] [name]      DOB: February 27, 1930      NWG:[id number]   Received request for assistance with a direct Hospice referral from the ER.  Communicated with ED attending regarding request.  Per ED attending and SW :Patient had a choking episode while eating and was noted to vomit up food.  At present sleeping without distress.  Xray without evidence for aspiration,and labs are not terribly deranged. Family requesting no significant intervention but want hospice referral stating she can come into the hospital if this will facilitate transition to hospice.  Unfortunately , because of the holiday hospice is closed to new referrals until Monday.   Patient is not in imminent danger and could be referred as soon as possible on Monday.   I have asked the social worker to facilitate a conversation between the physicians in the ED and the physician on call for the Facility.  If they are able and willing to return her to her facility and make the referral on Monday and family is agreeable then this is the right thing to do.   We are more than happy to see the patient as consultants upon admission to the hospital, if she meets criteria for admission, and facilitate this request once medically stable.  Merlyn Bollen L. Ladona Ridgel, MD MBA The Palliative Medicine Team at [facility] Phone: [phone] Pager: [phone]

## 2012-05-04 NOTE — ED Provider Notes (Signed)
History     CSN: [id number]  Arrival date & time 05/04/12  1445   First MD Initiated Contact with Patient 05/04/12 1503      Chief Complaint  Patient presents with  . Aspiration    (Consider location/radiation/quality/duration/timing/severity/associated sxs/prior treatment) HPI Comments: 33 y F with PMH of advanced dementia, HTN and hypothyroid here after from her memory care facility, [facility], after a witnessed choking episode that was followed by multiple episodes of NB/NB vomiting.  Per her daughter, the pt is altered from her baseline mental status and is much less interactive than usual.  Patient is a 79 y.o. female presenting with altered mental status. The history is provided by a relative. The history is limited by the condition of the patient.  Altered Mental Status This is a new problem. The current episode started today. The problem occurs constantly. The problem has been gradually worsening. Associated symptoms include vomiting (several episodes of NB/NB emesis that followed a choking episode while eating). Pertinent negatives include no fever. She has tried nothing for the symptoms.    Past Medical History  Diagnosis Date  . DIVERTICULOSIS, COLON 07/13/2007  . GERD 09/25/2008  . HYPERTENSION 12/09/2008  . HYPOTHYROIDISM 07/17/2006  . OSTEOARTHRITIS 11/16/2006  . OSTEOPOROSIS 07/17/2006  . Other and unspecified hyperlipidemia 11/19/2007  . OVERACTIVE BLADDER 06/08/2009  . RHUS DERMATITIS 08/10/2007  . Unspecified hearing loss 12/09/2008  . Unspecified urinary incontinence 09/02/2009  . Glaucoma     to left eye  . Dementia     Past Surgical History  Procedure Laterality Date  . Abdominal hysterectomy    . Tonsillectomy    . Knee arthroscopy      right  . Corneal transplant      left  . Total knee arthroplasty      No family history on file.  History  Substance Use Topics  . Smoking status: Former Smoker    Quit date: 01/18/1948  . Smokeless  tobacco: Never Used  . Alcohol Use: No    OB History   Grav Para Term Preterm Abortions TAB SAB Ect Mult Living                  Review of Systems  Unable to perform ROS: Dementia  Constitutional: Negative for fever.  Gastrointestinal: Positive for vomiting (several episodes of NB/NB emesis that followed a choking episode while eating).  Psychiatric/Behavioral: Positive for altered mental status.    Allergies  Latex and Codeine  Home Medications   Current Outpatient Rx  Name  Route  Sig  Dispense  Refill  . ALPRAZolam (XANAX) 0.25 MG tablet   Oral   Take 0.25 mg by mouth 2 (two) times daily.         . calcium-vitamin D (CALCIUM 500 +D) 500 MG tablet   Oral   Take 1 tablet by mouth 2 (two) times daily.           . Cholecalciferol (VITAMIN D3) 1000 UNITS capsule   Oral   Take 1,000 Units by mouth daily.           Marland Kitchen donepezil (ARICEPT) 10 MG tablet   Oral   Take 10 mg by mouth at bedtime.         Marland Kitchen levothyroxine (SYNTHROID, LEVOTHROID) 100 MCG tablet   Oral   Take 100 mcg by mouth daily.         . Multiple Vitamin (MULTIVITAMIN) tablet   Oral   Take 1 tablet by  mouth daily.           . Omega-3 Fatty Acids (FISH OIL) 1000 MG CAPS   Oral   Take 1 capsule by mouth daily.           Marland Kitchen oxybutynin (DITROPAN XL) 15 MG 24 hr tablet   Oral   Take 15 mg by mouth every morning.         Marland Kitchen oxybutynin (DITROPAN-XL) 10 MG 24 hr tablet      TAKE ONE TABLET BY MOUTH IN THE EVENING. TAKE IN ADDITION TO 15 MG TABLET   90 tablet   0   . sertraline (ZOLOFT) 50 MG tablet   Oral   Take 50 mg by mouth every evening.         . simvastatin (ZOCOR) 10 MG tablet   Oral   Take 10 mg by mouth every evening.           BP 134/60  Pulse 97  Temp(Src) 98.9 F (37.2 C) (Oral)  Resp 20  SpO2 96%  Physical Exam  Vitals reviewed. Constitutional: She appears well-developed and well-nourished. She appears listless.  Minimally interactive, drooling on herself   HENT:  Right Ear: External ear normal.  Left Ear: External ear normal.  Mouth/Throat: No oropharyngeal exudate.  Eyes: Conjunctivae and EOM are normal. Pupils are equal, round, and reactive to light.  Neck: Normal range of motion. Neck supple.  Cardiovascular: Normal rate, regular rhythm, normal heart sounds and intact distal pulses.  Exam reveals no gallop and no friction rub.   No murmur heard. Pulmonary/Chest: Effort normal and breath sounds normal.  Abdominal: Soft. Bowel sounds are normal. She exhibits no distension. There is no tenderness.  Musculoskeletal: Normal range of motion. She exhibits no edema.  Neurological: She has normal strength. She appears listless. She exhibits normal muscle tone.  Skin: Skin is warm and dry. No rash noted.  Psychiatric: She has a normal mood and affect.    ED Course  Procedures (including critical care time)  Labs Reviewed  CBC WITH DIFFERENTIAL - Abnormal; Notable for the following:    Hemoglobin 11.7 (*)    HCT 35.2 (*)    All other components within normal limits  URINALYSIS, ROUTINE W REFLEX MICROSCOPIC - Abnormal; Notable for the following:    Hgb urine dipstick TRACE (*)    All other components within normal limits  BASIC METABOLIC PANEL - Abnormal; Notable for the following:    Glucose, Bld 118 (*)    GFR calc non Af Amer 78 (*)    All other components within normal limits  URINE MICROSCOPIC-ADD ON - Abnormal; Notable for the following:    Crystals CA OXALATE CRYSTALS (*)    All other components within normal limits   Dg Chest 2 View  05/04/2012  *RADIOLOGY REPORT*  Clinical Data: Aspiration  CHEST - 2 VIEW  Comparison: 12/22/2011  Findings: Cardiomediastinal silhouette is stable.  Again noted osteopenia and mild degenerative changes thoracolumbar spine.  No acute infiltrate or pulmonary edema.  Stable bilateral basilar atelectasis or scarring.  IMPRESSION: No acute infiltrate or pulmonary edema.  Stable bilateral basilar  atelectasis or scarring.   Original Report Authenticated By: Natasha Mead, M.D.     Date: 05/04/2012  Rate: 97  Rhythm: normal sinus rhythm  QRS Axis: normal  Intervals: normal  ST/T Wave abnormalities: normal  Conduction Disutrbances:none  Narrative Interpretation: Left atrial hypertrophy, poor R-wave progression, motion artifact. When compared with ECG of 12/22/2011, no significant changes  are seen.  Old EKG Reviewed: unchanged   1. Altered mental status   2. Urinary tract infection   3. Choking episode   4. Dementia       MDM   4 y F with PMH of advanced dementia, HTN and hypothyroid here after from her memory care facility, [facility], after a witnessed choking episode that was followed by multiple episodes of NB/NB vomiting.  Per her daughter, the pt is altered from her baseline mental status and is much less interactive than usual.  AFVSS, pt minimally interactive, drooling on herself.  She will verbalize, but it is not comprehensible.  Lungs with rales in RML and RLL fields.  No edema.  Abd soft, NT.  I discussed goals of care with the daughter and it appears they are ready to pursue a palliative/hospice route as Mrs. Dinsmore has had a steady and progressive decline since the death of her husband.  She is interested in a limited evaluation for causes of her AMS.  CBC, BMP, Urine studies, CXR.  5:50 PM I have discussed the case with both the social worker and the Palliative Care physician and they have plans in place whether she is admitted or discharged.  We greatly appreciate their assistance.  Given her AMS and UTI we do not feel that she is stable enough for transfer back to her facility at this point.  Medicine consulted for admission.  Disposition: Admit  Condition: Stable  Pt seen in conjunction with my attending, Dr. Preston Fleeting.  Oleh Genin, MD PGY-II [facility] Emergency Medicine Resident         Oleh Genin, MD 05/05/12 [phone]

## 2012-05-04 NOTE — Progress Notes (Signed)
Clinical Social Work Department BRIEF PSYCHOSOCIAL ASSESSMENT 05/04/2012  Patient:  [name], [name]     Account Number:  [id number]     Admit date:  05/04/2012  Clinical Social Worker:  Leron Croak, CLINICAL SOCIAL WORKER  Date/Time:  05/04/2012 05:31 PM  Referred by:  Physician  Date Referred:  05/04/2012 Referred for  Residential hospice placement   Other Referral:   Interview type:  Family Other interview type:   Daughter at the bedside    PSYCHOSOCIAL DATA Living Status:  FACILITY Admitted from facility:  [facility] Level of care:  Skilled Nursing Facility Primary support name:  Gerilyn Nestle Primary support relationship to patient:  CHILD, ADULT Degree of support available:   Pt has a good support system    CURRENT CONCERNS Current Concerns  Other - See comment   Other Concerns:   Family has concerns about Pt's deteriating health with Alzheimers/Dementia    SOCIAL WORK ASSESSMENT / PLAN CSW met with the daughter at the bedside. Pt is having worsening symptoms from Alzheimers disease process and the daughter would like to have no further intervention at this time. Daughter wanted to see what the process was to have Pt transferred to the [facility]). CSW notified the MD to see if a Palliative consult would be warranted and possible placement from the ED. Dr. Ladona Ridgel from the [facility] team contacted CSW to inform CSW that a PAL consult is not typically done in the ED and that Pt would not need to be seen by PAL for a refferal to be made, but that in fact her facility would be able to make that referral on Monday. Family and ED MD notified about this recommendation. MD feels that Pt is needing InPt Tx at this time and will be admitting Pt for UTI and monitoring. Family is Ok with this decision and will speak with the MD further about medical findings and admission.   Assessment/plan status:  Information/Referral to [facility] Other  assessment/ plan:   Information/referral to community resources:   No information needed at this time. Hospital Inpt CSW will follow Pt and assist with D/C planning.    PATIENT'S/FAMILY'S RESPONSE TO PLAN OF CARE: Family is appreciative for assistance        Leron Croak, LCSWA [facility] Emergency Dept.  [phone]

## 2012-05-04 NOTE — ED Notes (Signed)
Patient back from x-ray to Pod B-19.

## 2012-05-04 NOTE — H&P (Signed)
[facility] History and Physical  [name] WUJ:[id number] DOB: 03/13/1930 DOA: 05/04/2012  Referring physician: er PCP: Rogelia Boga, MD  Specialists: palliative care  Chief Complaint: choking and vomiting  HPI: [name] is a 79 y.o. female  With advanced dementia- from a memory care unit.  At baseline patient speaks but talks nonsensically.  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  Today she had an episode of choking and then vomiting afterwards.  She was less responsive than her baseline after that.  She was brought to the ER for evaluation.  Work up in the ER U/A, chest x ray was unrevealing.  Family expressed interest in hospice care- either in a facility or inpatient residential.    Long discussion with family and they do not want to have an IV placed or have further investigation of AMS-  Patient is very close back to her baseline.  Family would like patient to be made comfortable.  We will feed patient with known aspiration risks.  Family knows she could be having seizure/TIA but only want things to be treated that interfere with her comfort  Social work has seen in Er and palliative care will see on floor  Review of Systems: unable to do full ROS   Past Medical History  Diagnosis Date  . DIVERTICULOSIS, COLON 07/13/2007  . GERD 09/25/2008  . HYPERTENSION 12/09/2008  . HYPOTHYROIDISM 07/17/2006  . OSTEOARTHRITIS 11/16/2006  . OSTEOPOROSIS 07/17/2006  . Other and unspecified hyperlipidemia 11/19/2007  . OVERACTIVE BLADDER 06/08/2009  . RHUS DERMATITIS 08/10/2007  . Unspecified hearing loss 12/09/2008  . Unspecified urinary incontinence 09/02/2009  . Glaucoma     to left eye  . Dementia    Past Surgical History  Procedure Laterality Date  . Abdominal hysterectomy    . Tonsillectomy    . Knee arthroscopy      right  . Corneal transplant      left  . Total knee arthroplasty     Social History:  reports that she quit smoking about 64 years ago. She has  never used smokeless tobacco. She reports that she does not drink alcohol or use illicit drugs. From memory care unit- patient is blind and required feeding and she is not ambulatory  Allergies  Allergen Reactions  . Latex Other (See Comments)    Swelling to area of contact  . Codeine Nausea And Vomiting    Family Hx: CAD  Prior to Admission medications   Medication Sig Start Date End Date Taking? Authorizing Provider  acetaminophen (TYLENOL) 500 MG tablet Take 500 mg by mouth every 4 (four) hours as needed for pain or fever.   Yes Historical Provider, MD  aspirin 81 MG chewable tablet Chew 81 mg by mouth daily.   Yes Historical Provider, MD  Besifloxacin HCl (BESIVANCE) 0.6 % SUSP Place 1 drop into the left eye every 8 (eight) hours.   Yes Historical Provider, MD  beta carotene w/minerals (OCUVITE) tablet Take 1 tablet by mouth daily.   Yes Historical Provider, MD  Calcium Carbonate-Vitamin D (CALCIUM 600 + D PO) Take 1 tablet by mouth daily.   Yes Historical Provider, MD  Cholecalciferol (VITAMIN D3) 1000 UNITS capsule Take 1,000 Units by mouth daily.     Yes Historical Provider, MD  clonazePAM (KLONOPIN) 0.5 MG tablet Take 0.25 mg by mouth every 12 (twelve) hours.   Yes Historical Provider, MD  donepezil (ARICEPT) 10 MG tablet Take 10 mg by mouth at bedtime.  Yes Historical Provider, MD  lamoTRIgine (LAMICTAL) 25 MG tablet Take 50 mg by mouth 2 (two) times daily.   Yes Historical Provider, MD  levothyroxine (SYNTHROID, LEVOTHROID) 112 MCG tablet Take 112 mcg by mouth daily before breakfast.   Yes Historical Provider, MD  memantine (NAMENDA) 10 MG tablet Take 10 mg by mouth daily.   Yes Historical Provider, MD  OLANZapine (ZYPREXA) 2.5 MG tablet Take 2.5 mg by mouth 2 (two) times daily.   Yes Historical Provider, MD  Omega-3 Fatty Acids (FISH OIL) 1000 MG CAPS Take 1 capsule by mouth daily.     Yes Historical Provider, MD  pravastatin (PRAVACHOL) 20 MG tablet Take 20 mg by mouth every  evening.   Yes Historical Provider, MD  prednisoLONE acetate (PRED FORTE) 1 % ophthalmic suspension Place 1 drop into the left eye 2 (two) times daily.   Yes Historical Provider, MD  moxifloxacin (VIGAMOX) 0.5 % ophthalmic solution 1 drop 3 (three) times daily.    Historical Provider, MD   Physical Exam: Filed Vitals:   05/04/12 1454  BP: 134/60  Pulse: 97  Temp: 98.9 F (37.2 C)  TempSrc: Oral  Resp: 20  SpO2: 96%     General:  Mumbles a few words, not consistently following commands  Eyes: wnl  ENT: wnl  Neck: supple  Cardiovascular: rrr  Respiratory: clear anterior  Abdomen: +BS, soft  Skin: few areas of bruising  Musculoskeletal: feet pronated from not walking  Neurologic: no focal deficit  Labs on Admission:  Basic Metabolic Panel:  Recent Labs Lab 05/04/12 1600  NA 140  K 3.7  CL 105  CO2 26  GLUCOSE 118*  BUN 16  CREATININE 0.75  CALCIUM 8.9   Liver Function Tests: No results found for this basename: AST, ALT, ALKPHOS, BILITOT, PROT, ALBUMIN,  in the last 168 hours No results found for this basename: LIPASE, AMYLASE,  in the last 168 hours No results found for this basename: AMMONIA,  in the last 168 hours CBC:  Recent Labs Lab 05/04/12 1600  WBC 8.7  NEUTROABS 6.5  HGB 11.7*  HCT 35.2*  MCV 91.0  PLT 278   Cardiac Enzymes: No results found for this basename: CKTOTAL, CKMB, CKMBINDEX, TROPONINI,  in the last 168 hours  BNP (last 3 results) No results found for this basename: PROBNP,  in the last 8760 hours CBG: No results found for this basename: GLUCAP,  in the last 168 hours  Radiological Exams on Admission: Dg Chest 2 View  05/04/2012  *RADIOLOGY REPORT*  Clinical Data: Aspiration  CHEST - 2 VIEW  Comparison: 12/22/2011  Findings: Cardiomediastinal silhouette is stable.  Again noted osteopenia and mild degenerative changes thoracolumbar spine.  No acute infiltrate or pulmonary edema.  Stable bilateral basilar atelectasis or  scarring.  IMPRESSION: No acute infiltrate or pulmonary edema.  Stable bilateral basilar atelectasis or scarring.   Original Report Authenticated By: Natasha Mead, M.D.       Assessment/Plan Principal Problem:   Choking episode Active Problems:   HYPOTHYROIDISM   Dementia   Altered mental status   1. Chocking episode- ask SLP to see and do bedside swallow, nothing further.  With patient's severe dementia, family is interested in palliative care and hospice- will consult Dr. Ladona Ridgel.  Treat symptomatically 2. Dementia/AMS- back to baseline  Palliative care  Code Status: DNR- discussed with family Family Communication: daughter and son in law at bedside Disposition Plan: hospice?  Time spent: 70 min  Jodi Criscuolo [facility]  Pager [phone]  If 7PM-7AM, please contact night-coverage [email or web address] Password [facility] 05/04/2012, 6:21 PM

## 2012-05-04 NOTE — ED Provider Notes (Signed)
79 year old female who is a resident of a memory care unit was observed to be choking and then vomited. Since then,, is noted her level of consciousness has decreased dramatically. She is no longer communicative. On exam she has normal vital signs. Lungs are clear and heart has regular rate and rhythm. Abdomen is soft and nontender. She is awake but does not respond to sternal rub or voice. Family wishes her to be in palliative care or hospice. Chest x-ray did not show evidence of pneumonia, but, aspiration may not show for 24 hours after the episode. She will likely need admission with a referral to palliative care or hospice.  Palliative care has been insulted and state that they cannot see her in tell him to 21st. Case is been discussed with Dr. Benjamine Mola of [facility] who agrees to admit the patient. Workup has been significant only for mild UTI which is probably not sufficient to explain her altered mentation.   Date: 05/04/2012  Rate: 97  Rhythm: normal sinus rhythm  QRS Axis: normal  Intervals: normal  ST/T Wave abnormalities: normal  Conduction Disutrbances:none  Narrative Interpretation: Left atrial hypertrophy, poor R-wave progression, marked muscle artifact. When compared with ECG of 12/22/2011, no significant changes are seen.  Old EKG Reviewed: unchanged  I saw and evaluated the patient, reviewed the resident's note and I agree with the findings and plan.   Dione Booze, MD 05/04/12 [phone]

## 2012-05-04 NOTE — ED Notes (Signed)
Patient transported to X-ray 

## 2012-05-04 NOTE — ED Notes (Addendum)
Pt from [facility]; was eatting and began vomiting; uncommunicative as coughing; coughing lessening and bringing up mucous with chunks of food in it. PT has hx of dementia; is currently able to talk. Not on a restrictive diet currently.

## 2012-05-05 DIAGNOSIS — R6889 Other general symptoms and signs: Secondary | ICD-10-CM

## 2012-05-05 DIAGNOSIS — Z515 Encounter for palliative care: Secondary | ICD-10-CM

## 2012-05-05 DIAGNOSIS — F0390 Unspecified dementia without behavioral disturbance: Secondary | ICD-10-CM

## 2012-05-05 DIAGNOSIS — R4182 Altered mental status, unspecified: Secondary | ICD-10-CM

## 2012-05-05 MED ORDER — SODIUM CHLORIDE 0.9 % IV SOLN
INTRAVENOUS | Status: DC
  Administered 2012-05-05: 15:00:00 via INTRAVENOUS

## 2012-05-05 MED ORDER — CIPROFLOXACIN HCL 0.3 % OP SOLN
1.0000 [drp] | Freq: Three times a day (TID) | OPHTHALMIC | Status: DC
  Administered 2012-05-05 – 2012-05-11 (×18): 1 [drp] via OPHTHALMIC
  Filled 2012-05-05: qty 2.5

## 2012-05-05 NOTE — Progress Notes (Signed)
[facility] PROGRESS NOTE  [name]:[id number] DOB: 09-30-1930 DOA: 05/04/2012 PCP: Claire Boga, MD  Assessment/Plan: 1. Chocking episode- ask SLP to see and do bedside swallow- suspect patient had CVA- discussed MRI with family- do not wish to pursue. With patient's severe dementia, family is interested in palliative care and hospice- mental status waxes and wanes- would like to feed 2. Dementia/AMS- blind and mumbles at baseline   Code Status: DNR Family Communication: daughter at bedside Disposition Plan: hospice   Consultants:  palliative  Procedures:  none  Antibiotics:  none  HPI/Subjective: Patient speaks a few words  Objective: Filed Vitals:   05/04/12 1915 05/04/12 2003 05/05/12 0636 05/05/12 0809  BP: 114/53 145/52 95/43 102/58  Pulse: 88 104 85 84  Temp:  97.3 F (36.3 C) 98.6 F (37 C) 97.8 F (36.6 C)  TempSrc:  Oral Axillary Oral  Resp: 20 20 22 22   Weight:  46.448 kg (102 lb 6.4 oz)    SpO2: 98% 98% 96% 98%    Intake/Output Summary (Last 24 hours) at 05/05/12 1239 Last data filed at 05/05/12 0600  Gross per 24 hour  Intake 1187.5 ml  Output      0 ml  Net 1187.5 ml   Filed Weights   05/04/12 2003  Weight: 46.448 kg (102 lb 6.4 oz)    Exam:   General:  Speaks few words  Cardiovascular: rrr  Respiratory: clear anterior  Abdomen: +BS, soft, NT  Musculoskeletal: moves all 4 ext   Data Reviewed: Basic Metabolic Panel:  Recent Labs Lab 05/04/12 1600  NA 140  K 3.7  CL 105  CO2 26  GLUCOSE 118*  BUN 16  CREATININE 0.75  CALCIUM 8.9   Liver Function Tests: No results found for this basename: AST, ALT, ALKPHOS, BILITOT, PROT, ALBUMIN,  in the last 168 hours No results found for this basename: LIPASE, AMYLASE,  in the last 168 hours No results found for this basename: AMMONIA,  in the last 168 hours CBC:  Recent Labs Lab 05/04/12 1600  WBC 8.7  NEUTROABS 6.5  HGB 11.7*  HCT 35.2*  MCV  91.0  PLT 278   Cardiac Enzymes: No results found for this basename: CKTOTAL, CKMB, CKMBINDEX, TROPONINI,  in the last 168 hours BNP (last 3 results) No results found for this basename: PROBNP,  in the last 8760 hours CBG: No results found for this basename: GLUCAP,  in the last 168 hours  No results found for this or any previous visit (from the past 240 hour(s)).   Studies: Dg Chest 2 View  05/04/2012  *RADIOLOGY REPORT*  Clinical Data: Aspiration  CHEST - 2 VIEW  Comparison: 12/22/2011  Findings: Cardiomediastinal silhouette is stable.  Again noted osteopenia and mild degenerative changes thoracolumbar spine.  No acute infiltrate or pulmonary edema.  Stable bilateral basilar atelectasis or scarring.  IMPRESSION: No acute infiltrate or pulmonary edema.  Stable bilateral basilar atelectasis or scarring.   Original Report Authenticated By: [name], M.D.     Scheduled Meds: . [COMPLETED] sodium chloride   Intravenous STAT  . Besifloxacin HCl  1 drop Left Eye Q8H  . clonazePAM  0.25 mg Oral Q12H  . donepezil  10 mg Oral QHS  . lamoTRIgine  50 mg Oral BID  . levothyroxine  112 mcg Oral QAC breakfast  . memantine  10 mg Oral Daily  . OLANZapine  2.5 mg Oral BID  . prednisoLONE acetate  1 drop Left Eye BID  Continuous Infusions: . sodium chloride      Principal Problem:   Choking episode Active Problems:   HYPOTHYROIDISM   Dementia   Altered mental status    Time spent: 35    [facility], [name]  [facility] Pager [phone]. If 7PM-7AM, please contact night-coverage at [email or web address], password [email or web address] 05/05/2012, 12:39 PM  LOS: 1 day

## 2012-05-05 NOTE — Evaluation (Signed)
Clinical/Bedside Swallow Evaluation Patient Details  Name: [name] MRN: [id number] Date of Birth: [birth year]  Today's Date: 05/05/2012 Time: 6295-2841 SLP Time Calculation (min): 13 min  Past Medical History:  Past Medical History  Diagnosis Date  . DIVERTICULOSIS, COLON 07/13/2007  . GERD 09/25/2008  . HYPERTENSION 12/09/2008  . HYPOTHYROIDISM 07/17/2006  . OSTEOARTHRITIS 11/16/2006  . OSTEOPOROSIS 07/17/2006  . Other and unspecified hyperlipidemia 11/19/2007  . OVERACTIVE BLADDER 06/08/2009  . RHUS DERMATITIS 08/10/2007  . Unspecified hearing loss 12/09/2008  . Unspecified urinary incontinence 09/02/2009  . Glaucoma     to left eye  . Dementia    Past Surgical History:  Past Surgical History  Procedure Laterality Date  . Abdominal hysterectomy    . Tonsillectomy    . Knee arthroscopy      right  . Corneal transplant      left  . Total knee arthroplasty     HPI:  79 yr old with advanced dementia- from a memory care unit. 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 Today she had an episode of choking and then vomiting afterwards. She was less responsive than her baseline after that. PMH:  GERD, diverticulosis, HTN.  CXR no acute infiltrate or pulmonary edema. Stable bilateral basilar atelectasis.  Family expressed interest in hospice care- either in a facility or inpatient residential. Long discussion with family and they do not want to have an IV placed or have further investigation of AMS- Patient is very close back to her baseline. Family would like patient to be made comfortable. We will feed patient with known aspiration risks. Family knows she could be having seizure/TIA but only want things to be treated that interfere with her comfort    Assessment / Plan / Recommendation Clinical Impression  Pt. lethargic but arousable for swallow assessment, confused and unable to follow commands for oral-motor/volitional cough/swallow.  Only allowed SLP to raise [facility] a moderate amount.  Pt.  noted to have an audible swallow intermittently throughout eval indicative of possible pharyngeal impairments.  Elevation of laynx decreased upon palpation.  Mastication of cracker was functional during this assessment.  No cough, throat clear or wet vocal quality detected however she may be silently aspirating.  Family met with Palliative care team and state they do not want any extra testing and desire for pt. to be comfortable.  Recommend she continue Dys 3 diet and thin liquids with aspiration precautions (family not at bedside).  Will f/u for 1-2 sessions for family education regarding safe feeding and swallow precautions.    Aspiration Risk  Moderate    Diet Recommendation Dysphagia 3 (Mechanical Soft);Thin liquid   Liquid Administration via: Cup;Straw Medication Administration: Crushed with puree Supervision: Full supervision/cueing for compensatory strategies;Staff feed patient Compensations: Slow rate;Small sips/bites;Check for pocketing Postural Changes and/or Swallow Maneuvers: Upright 30-60 min after meal;Seated upright 90 degrees    Other  Recommendations Oral Care Recommendations: Oral care BID   Follow Up Recommendations  None    Frequency and Duration min 1 x/week  1 week   Pertinent Vitals/Pain none    SLP Swallow Goals Goal #3: Family will demonstrate comprehension of appropriate diet texture and swallow precautions with min verbal assist.   Swallow Study         Oral/Motor/Sensory Function Overall Oral Motor/Sensory Function:  (pt. cognitively unable) Labial Symmetry: Within Functional Limits Facial Symmetry: Within Functional Limits   Ice Chips Ice chips: Not tested   Thin Liquid Thin Liquid: Impaired Presentation: Cup;Straw Pharyngeal  Phase Impairments: Decreased hyoid-laryngeal movement (audible swallow)    Nectar Thick Nectar Thick Liquid: Not tested   Honey Thick Honey Thick Liquid: Not tested   Puree Puree: Impaired Pharyngeal Phase Impairments:  Decreased hyoid-laryngeal movement (audible swallow)   Solid   GO    Solid:  (functional for cracker)       Breck Coons Lonell Face.Ed [employer] [phone]  05/05/2012

## 2012-05-05 NOTE — Progress Notes (Signed)
Utilization Review Completed.   Ellery Tash, RN, BSN Nurse Case Manager  [phone]  

## 2012-05-05 NOTE — Consult Note (Signed)
Patient [name]      DOB: 07-Nov-1930      WUJ:[id number]     Consult Note from the Palliative Medicine Team at [facility]    Consult Requested by: Dr. Benjamine Mola     PCP: Rogelia Boga, MD Reason for Consultation: GOC     Phone [phone] Rec symptom managment Assessment of patients Current state: 79 yr old white female with advanced vascular dementia. Presented to the ER after choking on her lunch and vomiting.  The patient up until this past Thursday resided in a memory unit, was wheelchair bound but able to scoot a little.  Her daughter states mostly her speech has been affected over the years by her dementia.  Her daughter wanted to consider hospice care for her mom at this point in her illness but has been torn about making that decision.  The patient evidently is having vascillating  Periods of being awake and periods of being more altered . There is some question of whether she has had a stroke as she is not using her right arm.  For now the patient is affirmed as a DNR and her daughter is going home to talk with her sons about what to do next.  We talked about antibiotic use vs treating symptoms for comfort,  continuing vs discontinuing IV fluids , and criteria for [facility].  Overall,  She is favoring comfort care path.   Goals of Care: 1.  Code Status: DNR   2. Scope of Treatment: Continue current low dose IV, oral meds if she is able to take them,  Comfort feed if awake and alert . Please try to obtain the swallowing evaluation if she is able to participate.    4. Disposition: To be determined.  If not eating or drinking she would be a candidate for Residential Hospice   3. Symptom Management:   1. Anxiety/Agitation: Try to continue her clonazepam, aricept, lamictal, namenda and zyprexa.  At a minimum give the clonazepam crushed and suspended in a little liquid 2.  Q 2 hour mouth care. 3.  Dysphagia-  Speech evaluation 4. Psychosocial:  Patient has  been ill for the last 10 yrs with dementia, debilitated for at least the last 5-6 yrs  5. Spiritual: Not discussed at this time.          Patient Documents Completed or Given: Document Given Completed  Advanced Directives Pkt    MOST    DNR    Gone from My Sight    Hard Choices      Brief HPI: Patient is a 79 yr old white female with known vascular dementia with behavior.  Patient had been at home with assistance up until last one month.  Had hard time adjusting to SNF care required [facility] stay to adjust medications.  Yesterday patient became choked while eating and vomited .  Was awake last evening.  Admitted for observation as family expressed a desire to pursue hospice care for her.  Today daughter states that she feels that her mother looks like she has a draw to her face, and is not using her right arm.  She stated she did not want an MRI unless it would help [name] tell her family what her prognosis would be.  We were asked to assist with affirming goals of care which have varied, and if appropriate help with Residential placement.   ROS: Not able to express needs    PMH:  Past Medical History  Diagnosis  Date  . DIVERTICULOSIS, COLON 07/13/2007  . GERD 09/25/2008  . HYPERTENSION 12/09/2008  . HYPOTHYROIDISM 07/17/2006  . OSTEOARTHRITIS 11/16/2006  . OSTEOPOROSIS 07/17/2006  . Other and unspecified hyperlipidemia 11/19/2007  . OVERACTIVE BLADDER 06/08/2009  . RHUS DERMATITIS 08/10/2007  . Unspecified hearing loss 12/09/2008  . Unspecified urinary incontinence 09/02/2009  . Glaucoma     to left eye  . Dementia      PSH: Past Surgical History  Procedure Laterality Date  . Abdominal hysterectomy    . Tonsillectomy    . Knee arthroscopy      right  . Corneal transplant      left  . Total knee arthroplasty     I have reviewed the FH and SH and  If appropriate update it with new information. Allergies  Allergen Reactions  . Latex Other (See Comments)    Swelling to  area of contact  . Codeine Nausea And Vomiting   Scheduled Meds: . [COMPLETED] sodium chloride   Intravenous STAT  . Besifloxacin HCl  1 drop Left Eye Q8H  . clonazePAM  0.25 mg Oral Q12H  . donepezil  10 mg Oral QHS  . lamoTRIgine  50 mg Oral BID  . levothyroxine  112 mcg Oral QAC breakfast  . memantine  10 mg Oral Daily  . OLANZapine  2.5 mg Oral BID  . prednisoLONE acetate  1 drop Left Eye BID   Continuous Infusions:  PRN Meds:.acetaminophen, acetaminophen, ondansetron (ZOFRAN) IV, ondansetron    BP 102/58  Pulse 84  Temp(Src) 97.8 F (36.6 C) (Oral)  Resp 22  Wt 46.448 kg (102 lb 6.4 oz)  BMI 18.72 kg/m2  SpO2 98%   PPS: had been 40 % in wheelchair scooting up to table, awake and spontaneously talking this past Thursday.   Intake/Output Summary (Last 24 hours) at 05/05/12 1128 Last data filed at 05/05/12 0600  Gross per 24 hour  Intake 1187.5 ml  Output      0 ml  Net 1187.5 ml   LBM: PTA                     Physical Exam:  General: Blind at baseline, decreased hearing with hearing aide,  Rousing slightly answer some questions, quietly, not animated HEENT:  Left, eye mucoid material,  Right eye slightly open, mm dry, coughed up some mucous with brown tinge Chest:   Decreased with poor air entry, no wheezing our rales CVS: regular  Rate and rhythm, S1, S2 Abdomen:soft, no grimace on palpation, positive bowel sounds Ext: using left arm,  Right arm is at side with hand curled, not able to squeeze on command Neuro:soft, speaking but not spontaneously moving.     Labs: CBC    Component Value Date/Time   WBC 8.7 05/04/2012 1600   RBC 3.87 05/04/2012 1600   HGB 11.7* 05/04/2012 1600   HCT 35.2* 05/04/2012 1600   PLT 278 05/04/2012 1600   MCV 91.0 05/04/2012 1600   MCH 30.2 05/04/2012 1600   MCHC 33.2 05/04/2012 1600   RDW 12.8 05/04/2012 1600   LYMPHSABS 1.3 05/04/2012 1600   MONOABS 0.8 05/04/2012 1600   EOSABS 0.0 05/04/2012 1600   BASOSABS 0.0 05/04/2012 1600       CMP     Component Value Date/Time   NA 140 05/04/2012 1600   K 3.7 05/04/2012 1600   CL 105 05/04/2012 1600   CO2 26 05/04/2012 1600   GLUCOSE 118* 05/04/2012 1600  BUN 16 05/04/2012 1600   CREATININE 0.75 05/04/2012 1600   CALCIUM 8.9 05/04/2012 1600   PROT 7.1 12/22/2011 1635   ALBUMIN 3.4* 12/22/2011 1635   AST 21 12/22/2011 1635   ALT 14 12/22/2011 1635   ALKPHOS 76 12/22/2011 1635   BILITOT 0.2* 12/22/2011 1635   GFRNONAA 78* 05/04/2012 1600   GFRAA >90 05/04/2012 1600    Chest Xray Reviewed/Impressions: clear, no consolidation     Time In Time Out Total Time Spent with Patient Total Overall Time  1025 am 1115 am 20 min 50 min    Greater than 50%  of this time was spent counseling and coordinating care related to the above assessment and plan.  Allison Silva L. Ladona Ridgel, MD MBA The Palliative Medicine Team at [facility] Phone: [phone] Pager: [phone]

## 2012-05-06 ENCOUNTER — Inpatient Hospital Stay (HOSPITAL_COMMUNITY): Payer: Medicare Other

## 2012-05-06 MED ORDER — ENOXAPARIN SODIUM 30 MG/0.3ML ~~LOC~~ SOLN
30.0000 mg | SUBCUTANEOUS | Status: DC
  Administered 2012-05-06 – 2012-05-11 (×6): 30 mg via SUBCUTANEOUS
  Filled 2012-05-06 (×6): qty 0.3

## 2012-05-06 MED ORDER — ENOXAPARIN SODIUM 40 MG/0.4ML ~~LOC~~ SOLN
40.0000 mg | SUBCUTANEOUS | Status: DC
  Filled 2012-05-06: qty 0.4

## 2012-05-06 MED ORDER — ALPRAZOLAM 0.25 MG PO TABS
0.2500 mg | ORAL_TABLET | Freq: Three times a day (TID) | ORAL | Status: DC | PRN
  Administered 2012-05-06 – 2012-05-10 (×3): 0.25 mg via ORAL
  Filled 2012-05-06 (×3): qty 1

## 2012-05-06 MED ORDER — PIPERACILLIN-TAZOBACTAM 3.375 G IVPB
3.3750 g | Freq: Three times a day (TID) | INTRAVENOUS | Status: DC
  Administered 2012-05-06 – 2012-05-09 (×9): 3.375 g via INTRAVENOUS
  Filled 2012-05-06 (×11): qty 50

## 2012-05-06 NOTE — Progress Notes (Addendum)
Patient [id number] [name]      DOB: 1930-11-30      [id number]:[id number]  Patient has developed fever and cough.  Family came together and at this time her daughter is not ready to progress into full comfort care.  They would like to check chest xray and initiate antibiotics.  If she does not respond they will likely discontinue antibiotics and move to full comfort with pursuit of Residential placement.  Exam : reveals flushed skin, and right basilar crackles, no wheezing.   Will discuss with Dr. Benjamine Mola.  [name], MD MBA The Palliative Medicine Team at [facility] Phone: [phone] Pager: [phone]

## 2012-05-06 NOTE — Progress Notes (Signed)
Patient [name]      DOB: Oct 27, 1930      WUJ:[id number]   Palliative Medicine Team at [facility] Progress Note    Subjective:  Patient is much more awake and alert this am.  She is using both her right and left arms today.  Speaking clearly asking for her daughter by name.  I updated her daughter by phone.  They will be in later today.  If the patient continues to maintain po intake she would still likely meet criteria for hospice to follow but may not meet the criteria for Residential Hospice.  Daughter aware of this.  Daughter will likely want placement at a facility that will work with hospice to not return mom to the hospital if she get sick again.  Family focusing on comfort.   Filed Vitals:   05/06/12 0519  BP: 131/47  Pulse: 94  Temp: 98.4 F (36.9 C)  Resp: 20   Physical exam: General: fidgeting, speaking spontaneously Blind, mucous left eye, mmm Chest :  Decreased but clear CVS:  Regular, S1, S2,  III/VI SEM not documented yesterday Abd: soft, not tender not distended Ext: warm , no mottling, 2 + pulses Neuro: awake , alert , not oriented to time or place    Assessment and plan: 79 yr old white female with advanced vascular dementia admitted after a choking episode at her dementia unit with subsequent vomiting.  There was some concern for possible stroke as she was not using her right hand as much as she has been.  Today she appears back to described baseline, eating talking using both hands.  Family has been updated.  Patient likely meets criteria for 6 months or less prognosis for general hospice care. If she continues to feed by mouth will likely not meet residential hospice criteria.    1.  DNR/ DNI  2.  Dysphagia: comfort feed best texture per speech at this time is Dysphagia 3 with thins, crush meds in puree.  Upright 30-60 min after meal. Aspiration percautions  3.  Vascular dementia with behavior:  Continue home medications for behavioral  control.  Discussed with Gunnar Fusi by phone.  Total time 830 am-858  am

## 2012-05-06 NOTE — Progress Notes (Signed)
ANTIBIOTIC CONSULT NOTE - INITIAL  Pharmacy Consult for Zosyn Indication: suspected aspiration pneumonia  Allergies  Allergen Reactions  . Latex Other (See Comments)    Swelling to area of contact  . Codeine Nausea And Vomiting    Patient Measurements: Height: 5' 1.81" (157 cm) Weight: 107 lb 2.3 oz (48.6 kg) IBW/kg (Calculated) : 49.67   Vital Signs: Temp: 100.5 F (38.1 C) (04/20 0914) Temp src: Axillary (04/20 0914) BP: 130/75 mmHg (04/20 0914) Pulse Rate: 99 (04/20 0914) Intake/Output from previous day: 04/19 0701 - 04/20 0700 In: 754.8 [P.O.:600; I.V.:154.8] Out: -  Intake/Output from this shift: Total I/O In: 120 [P.O.:120] Out: -   Labs:  Recent Labs  05/04/12 1600  WBC 8.7  HGB 11.7*  PLT 278  CREATININE 0.75   Estimated Creatinine Clearance: 43.7 ml/min (by C-G formula based on Cr of 0.75). No results found for this basename: VANCOTROUGH, VANCOPEAK, VANCORANDOM, GENTTROUGH, GENTPEAK, GENTRANDOM, TOBRATROUGH, TOBRAPEAK, TOBRARND, AMIKACINPEAK, AMIKACINTROU, AMIKACIN,  in the last 72 hours   Microbiology: No results found for this or any previous visit (from the past 720 hour(s)).  Medical History: Past Medical History  Diagnosis Date  . DIVERTICULOSIS, COLON 07/13/2007  . GERD 09/25/2008  . HYPERTENSION 12/09/2008  . HYPOTHYROIDISM 07/17/2006  . OSTEOARTHRITIS 11/16/2006  . OSTEOPOROSIS 07/17/2006  . Other and unspecified hyperlipidemia 11/19/2007  . OVERACTIVE BLADDER 06/08/2009  . RHUS DERMATITIS 08/10/2007  . Unspecified hearing loss 12/09/2008  . Unspecified urinary incontinence 09/02/2009  . Glaucoma     to left eye  . Dementia     Assessment: 78 yoF admitted 4/18 with choking and vomiting. Zosyn per pharmacy to start for suspected aspiration pneumonia. Patient with severe baseline dementia, but has been cleared to eat per speech.   Chest Xray negative for infiltrates. Tm 100.5, WBC normal at 8.7. No cultures have been drawn.  SCr normal  with est CrCl 40 ml/min. Pt making good urine, but not accurately documented.  Noted per palliative care note that patient's family wishes to pursue antibiotics at this time, but if patient does not improve clinically, antibiotics will be discontinued and full comfort care will be pursued.  We will continue to follow along.  Goal of Therapy:  eradication of infection  Plan:  Zosyn 3.375g IV q8h infused over 4 hours F/u renal function, fever curve, clinical course, abx LOT   Thank you for the consult.  Tomi Bamberger, PharmD Clinical Pharmacist Pager: [phone] Pharmacy: [phone] 05/06/2012 1:19 PM

## 2012-05-06 NOTE — Progress Notes (Signed)
[facility] PROGRESS NOTE  [name] ZOX:[id number] DOB: 1930-08-19 DOA: 05/04/2012 PCP: Rogelia Boga, MD  Assessment/Plan: 1. Chocking episode- ask SLP to see and do bedside swallow- suspect patient had CVA- discussed MRI with family- do not wish to pursue. With patient's severe dementia, family is interested in palliative care and hospice- mental status waxes and wanes- eating with modified diet DYS 3 2. 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 Fever- prob aspiration PNA- give abx   Code Status: DNR Family Communication: daughter at bedside Disposition Plan: hospice   Consultants:  palliative  Procedures:  none  Antibiotics:  none  HPI/Subjective: Patient speaks a few words Fever last PM  Objective: Filed Vitals:   05/05/12 1943 05/06/12 0519 05/06/12 0914 05/06/12 1300  BP: 116/69 131/47 130/75   Pulse: 90 94 99   Temp: 98.4 F (36.9 C) 98.4 F (36.9 C) 100.5 F (38.1 C)   TempSrc: Oral Oral Axillary   Resp: 20 20 20    Height:    5' 1.81" (1.57 m)  Weight: 48.626 kg (107 lb 3.2 oz)   48.6 kg (107 lb 2.3 oz)  SpO2: 97% 98% 100%     Intake/Output Summary (Last 24 hours) at 05/06/12 1314 Last data filed at 05/06/12 0816  Gross per 24 hour  Intake 874.83 ml  Output      0 ml  Net 874.83 ml   Filed Weights   05/04/12 2003 05/05/12 1943 05/06/12 1300  Weight: 46.448 kg (102 lb 6.4 oz) 48.626 kg (107 lb 3.2 oz) 48.6 kg (107 lb 2.3 oz)    Exam:   General:  Speaks few words  Cardiovascular: rrr  Respiratory: clear anterior  Abdomen: +BS, soft, NT  Musculoskeletal: moves all 4 ext   Data Reviewed: Basic Metabolic Panel:  Recent Labs Lab 05/04/12 1600  NA 140  K 3.7  CL 105  CO2 26  GLUCOSE 118*  BUN 16  CREATININE 0.75  CALCIUM 8.9   Liver Function Tests: No results found for this basename: AST, ALT, ALKPHOS, BILITOT, PROT, ALBUMIN,  in the last 168 hours No results found for this basename: LIPASE, AMYLASE,   in the last 168 hours No results found for this basename: AMMONIA,  in the last 168 hours CBC:  Recent Labs Lab 05/04/12 1600  WBC 8.7  NEUTROABS 6.5  HGB 11.7*  HCT 35.2*  MCV 91.0  PLT 278   Cardiac Enzymes: No results found for this basename: CKTOTAL, CKMB, CKMBINDEX, TROPONINI,  in the last 168 hours BNP (last 3 results) No results found for this basename: PROBNP,  in the last 8760 hours CBG: No results found for this basename: GLUCAP,  in the last 168 hours  No results found for this or any previous visit (from the past 240 hour(s)).   Studies: Dg Chest 2 View  05/04/2012  *RADIOLOGY REPORT*  Clinical Data: Aspiration  CHEST - 2 VIEW  Comparison: 12/22/2011  Findings: Cardiomediastinal silhouette is stable.  Again noted osteopenia and mild degenerative changes thoracolumbar spine.  No acute infiltrate or pulmonary edema.  Stable bilateral basilar atelectasis or scarring.  IMPRESSION: No acute infiltrate or pulmonary edema.  Stable bilateral basilar atelectasis or scarring.   Original Report Authenticated By: Natasha Mead, M.D.    Dg Chest Port 1 View  05/06/2012  *RADIOLOGY REPORT*  Clinical Data: 79 year old female with cough, congestion.  Fever. Choking.  Possible aspiration.  PORTABLE CHEST - 1 VIEW  Comparison: 05/04/2012 and earlier.  Findings: Portable semi upright  AP view at 1255 hours.  Larger lung volumes.  Stable cardiac size and mediastinal contours.  No pneumothorax or pulmonary edema.  No consolidation or definite effusion.  Minimal bibasilar opacity appears stable to 12/22/2011, favor atelectasis or scarring.  No acute pulmonary opacity identified.  IMPRESSION: No acute cardiopulmonary abnormality.   Original Report Authenticated By: Erskine Speed, M.D.     Scheduled Meds: . ciprofloxacin  1 drop Left Eye Q8H  . clonazePAM  0.25 mg Oral Q12H  . donepezil  10 mg Oral QHS  . enoxaparin (LOVENOX) injection  30 mg Subcutaneous Q24H  . lamoTRIgine  50 mg Oral BID  .  levothyroxine  112 mcg Oral QAC breakfast  . memantine  10 mg Oral Daily  . OLANZapine  2.5 mg Oral BID  . piperacillin-tazobactam (ZOSYN)  IV  3.375 g Intravenous Q8H  . prednisoLONE acetate  1 drop Left Eye BID   Continuous Infusions:    Principal Problem:   Choking episode Active Problems:   HYPOTHYROIDISM   Dementia   Altered mental status    Time spent: 25    [facility], JESSICA  [facility] Pager [phone]. If 7PM-7AM, please contact night-coverage at [email or web address], password [facility] 05/06/2012, 1:14 PM  LOS: 2 days

## 2012-05-07 ENCOUNTER — Encounter: Payer: Self-pay | Admitting: Emergency Medicine

## 2012-05-07 DIAGNOSIS — R509 Fever, unspecified: Secondary | ICD-10-CM

## 2012-05-07 DIAGNOSIS — J690 Pneumonitis due to inhalation of food and vomit: Secondary | ICD-10-CM

## 2012-05-07 MED ORDER — VANCOMYCIN HCL IN DEXTROSE 1-5 GM/200ML-% IV SOLN
1000.0000 mg | INTRAVENOUS | Status: DC
  Administered 2012-05-07 – 2012-05-08 (×2): 1000 mg via INTRAVENOUS
  Filled 2012-05-07 (×3): qty 200

## 2012-05-07 NOTE — Progress Notes (Signed)
Nutrition Brief Note  Chart reviewed. Pt now transitioning to comfort care. Per GOC note on 4/19 - family would like comfort feeds if awake/alert. No further nutrition interventions warranted at this time.  Please re-consult as needed.   Jarold Motto MS, RD, LDN Pager: [phone] After-hours pager: [phone]

## 2012-05-07 NOTE — Progress Notes (Signed)
Patient [name]      DOB: 1930/12/14      KVQ:[id number]   Palliative Medicine Team at [facility] Progress Note    Subjective:  Patient with persistent high grade fever.  Chest xry did not confirm pneumonia, but exam showed right basilar crackles.  Patient less responsive today.  Plan to continue broad spectrum antibiotics and reevaluate in the am.  Opted to not obtain blood cultures as this will not change her outcome.  If not progressing family discussing discontinuation of antibiotics in favor of comfort care/ likely hospice.     Filed Vitals:   05/07/12 1358  BP: 112/70  Pulse: 100  Temp: 102.9 F (39.4 C)  Resp: 25   Physical exam:  General: only mumbling.  Can't make out words like yesterday.  Pupils equal but blind bilaterally, mucous in left eye as before Chest: very poor air entry can't make out crackles as present yesterday CVS: tachy , S1,S2  II/VI murmur ABd: soft, no grimace on palpation Ext: very warm, flushed, no mottling Neuro: less responsive  Assessment and plan: 79 yr old with choking spell and vomiting , now with fever.  Xray not showing fulminant aspiration pneumonia but exam yesterday showed crackles and cough.  Broad spectrum antibiotics initiated at family request.  They have been struggling with comfort vs curative care.   1.  DNR/DNI  2.  Fever:  Presumed aspiration pneumonia, but xray not supporting , question poorly hydration.  Plan see if another 24 hours of broad spectrum antibiotics will change course.  In other overall scheme of her Stage 7 Dementia family is considering full comfort measures.  Prognosis likely poor Disposition to be determined.   Total time 30 min.  300 pm - 330 pm   Shantae Vantol L. Ladona Ridgel, MD MBA The Palliative Medicine Team at [facility] Phone: [phone] Pager: [phone]

## 2012-05-07 NOTE — Progress Notes (Signed)
ANTIBIOTIC CONSULT NOTE - INITIAL  Pharmacy Consult for Vanco Indication: Continuing fevers  Allergies  Allergen Reactions  . Latex Other (See Comments)    Swelling to area of contact  . Codeine Nausea And Vomiting    Patient Measurements: Height: 5' 1.81" (157 cm) Weight: 107 lb 2.3 oz (48.6 kg) IBW/kg (Calculated) : 49.67 Adjusted Body Weight:    Vital Signs: Temp: 100.7 F (38.2 C) (04/21 0935) Temp src: Oral (04/21 0935) BP: 105/66 mmHg (04/21 0935) Pulse Rate: 110 (04/21 0935) Intake/Output from previous day: 04/20 0701 - 04/21 0700 In: 460 [P.O.:360; IV Piggyback:100] Out: -  Intake/Output from this shift: Total I/O In: 240 [P.O.:240] Out: -   Labs:  Recent Labs  05/04/12 1600  WBC 8.7  HGB 11.7*  PLT 278  CREATININE 0.75   Estimated Creatinine Clearance: 43.7 ml/min (by C-G formula based on Cr of 0.75). No results found for this basename: VANCOTROUGH, VANCOPEAK, VANCORANDOM, GENTTROUGH, GENTPEAK, GENTRANDOM, TOBRATROUGH, TOBRAPEAK, TOBRARND, AMIKACINPEAK, AMIKACINTROU, AMIKACIN,  in the last 72 hours   Microbiology: No results found for this or any previous visit (from the past 720 hour(s)).  Medical History: Past Medical History  Diagnosis Date  . DIVERTICULOSIS, COLON 07/13/2007  . GERD 09/25/2008  . HYPERTENSION 12/09/2008  . HYPOTHYROIDISM 07/17/2006  . OSTEOARTHRITIS 11/16/2006  . OSTEOPOROSIS 07/17/2006  . Other and unspecified hyperlipidemia 11/19/2007  . OVERACTIVE BLADDER 06/08/2009  . RHUS DERMATITIS 08/10/2007  . Unspecified hearing loss 12/09/2008  . Unspecified urinary incontinence 09/02/2009  . Glaucoma     to left eye  . Dementia    Assessment: 23 yoF admitted 4/18 with choking and vomiting  ID: ? Aspiration pna after an episode of choking/vomiting. CXR negative for infiltrates. Tm 100.7, WBC 8.7. No cultures. Order to add Vancomycin today.  Zosyn 4/20>> Vanco 4/21>>   Goal of Therapy:  Vancomycin trough level 15-20  mcg/ml  Plan:  Zosyn 3.375g IV q8h infused over 4 hours Vancomycin 1g IV q24h. Trough after 3-5 doses at steady state.  Merilynn Finland, [employer] 05/07/2012,11:30 AM

## 2012-05-07 NOTE — Care Management Note (Signed)
   CARE MANAGEMENT NOTE 05/07/2012  Patient:  [name], [name]   Account Number:  [id number]  Date Initiated:  05/07/2012  Documentation initiated by:  Nikyah Lackman  Subjective/Objective Assessment:   Per record pt is resident of [facility] an ALF. Per Genevieve Norlander rep this agency is active with this pt.     Action/Plan:   CM following for resumption of orders for [place].   Anticipated DC Date:     Anticipated DC Plan:  ASSISTED LIVING / REST HOME         Choice offered to / List presented to:             Status of service:  In process, will continue to follow Medicare Important Message given?   (If response is "NO", the following Medicare IM given date fields will be blank) Date Medicare IM given:   Date Additional Medicare IM given:    Discharge Disposition:  ASSISTED LIVING  Per UR Regulation:    If discussed at Long Length of Stay Meetings, dates discussed:    Comments:

## 2012-05-07 NOTE — Progress Notes (Addendum)
[facility] PROGRESS NOTE  [name] ZOX:[id number] DOB: 11/25/1930 DOA: 05/04/2012 PCP: Claire Boga, MD  Assessment/Plan: 1. Choking episode- DYS  3 diet- ? CVA discussed MRI with family- do not wish to pursue. With patient's severe dementia, family is interested in palliative care and hospice- mental status waxes and wanes- 2. 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 Fever- x ray not impressive for PNA- vanc/zosyn   Code Status: DNR Family Communication: called family- would like to speak with hospice today Disposition Plan: hospice   Consultants:  palliative  Procedures:  none  Antibiotics:  none  HPI/Subjective: Patient speaks a few words Fever again  Objective: Filed Vitals:   05/06/12 1755 05/06/12 2119 05/07/12 0533 05/07/12 0935  BP: 115/51 124/58 99/44 105/66  Pulse: 90 96 85 110  Temp: 98.6 F (37 C) 100.1 F (37.8 C) 99.5 F (37.5 C) 100.7 F (38.2 C)  TempSrc: Axillary Axillary Axillary Oral  Resp: 19 20 20 28   Height:      Weight:      SpO2: 97% 97% 96% 91%    Intake/Output Summary (Last 24 hours) at 05/07/12 1153 Last data filed at 05/07/12 0900  Gross per 24 hour  Intake    580 ml  Output      0 ml  Net    580 ml   Filed Weights   05/04/12 2003 05/05/12 1943 05/06/12 1300  Weight: 46.448 kg (102 lb 6.4 oz) 48.626 kg (107 lb 3.2 oz) 48.6 kg (107 lb 2.3 oz)    Exam:   General:  Speaks few words  Cardiovascular: rrr  Respiratory: no wheezing, decreased effort  Abdomen: +BS, soft, NT  Musculoskeletal: moves all 4 ext   Data Reviewed: Basic Metabolic Panel:  Recent Labs Lab 05/04/12 1600  NA 140  K 3.7  CL 105  CO2 26  GLUCOSE 118*  BUN 16  CREATININE 0.75  CALCIUM 8.9   Liver Function Tests: No results found for this basename: AST, ALT, ALKPHOS, BILITOT, PROT, ALBUMIN,  in the last 168 hours No results found for this basename: LIPASE, AMYLASE,  in the last 168 hours No results found for  this basename: AMMONIA,  in the last 168 hours CBC:  Recent Labs Lab 05/04/12 1600  WBC 8.7  NEUTROABS 6.5  HGB 11.7*  HCT 35.2*  MCV 91.0  PLT 278   Cardiac Enzymes: No results found for this basename: CKTOTAL, CKMB, CKMBINDEX, TROPONINI,  in the last 168 hours BNP (last 3 results) No results found for this basename: PROBNP,  in the last 8760 hours CBG: No results found for this basename: GLUCAP,  in the last 168 hours  No results found for this or any previous visit (from the past 240 hour(s)).   Studies: Dg Chest Port 1 View  05/06/2012  *RADIOLOGY REPORT*  Clinical Data: 79 year old female with cough, congestion.  Fever. Choking.  Possible aspiration.  PORTABLE CHEST - 1 VIEW  Comparison: 05/04/2012 and earlier.  Findings: Portable semi upright AP view at 1255 hours.  Larger lung volumes.  Stable cardiac size and mediastinal contours.  No pneumothorax or pulmonary edema.  No consolidation or definite effusion.  Minimal bibasilar opacity appears stable to 12/22/2011, favor atelectasis or scarring.  No acute pulmonary opacity identified.  IMPRESSION: No acute cardiopulmonary abnormality.   Original Report Authenticated By: Erskine Speed, M.D.     Scheduled Meds: . ciprofloxacin  1 drop Left Eye Q8H  . clonazePAM  0.25 mg Oral Q12H  .  donepezil  10 mg Oral QHS  . enoxaparin (LOVENOX) injection  30 mg Subcutaneous Q24H  . lamoTRIgine  50 mg Oral BID  . levothyroxine  112 mcg Oral QAC breakfast  . memantine  10 mg Oral Daily  . OLANZapine  2.5 mg Oral BID  . piperacillin-tazobactam (ZOSYN)  IV  3.375 g Intravenous Q8H  . prednisoLONE acetate  1 drop Left Eye BID  . vancomycin  1,000 mg Intravenous Q24H   Continuous Infusions:    Principal Problem:   Choking episode Active Problems:   HYPOTHYROIDISM   Dementia   Altered mental status    Time spent: 25    [facility], [name]  [facility] Pager [phone]. If 7PM-7AM, please contact night-coverage at [email or web address],  password [facility] 05/07/2012, 11:53 AM  LOS: 3 days

## 2012-05-08 NOTE — Progress Notes (Addendum)
[facility] PROGRESS NOTE  [name] ZOX:[id number] DOB: Jul 31, 1930 DOA: 05/04/2012 PCP: Rogelia Boga, MD  Assessment/Plan: 1. Choking episode- DYS  3 diet- ? CVA discussed MRI with family- do not wish to pursue. With patient's severe dementia, family is interested in palliative care and hospice- mental status waxes and wanes- 2. 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 Fever- x ray not impressive for PNA- vanc/zosyn- no fever in 24 hours   Code Status: DNR Family Communication: no family at bedside Disposition Plan: hospice   Consultants:  palliative  Procedures:  none  Antibiotics:  none  HPI/Subjective: Patient speaks a few words No fever in 24 hours  Objective: Filed Vitals:   05/07/12 1835 05/07/12 2224 05/08/12 0418 05/08/12 1000  BP: 86/54 105/48 120/47 120/55  Pulse: 81 91 83 99  Temp: 98.3 F (36.8 C) 100.3 F (37.9 C) 97.8 F (36.6 C) 98.8 F (37.1 C)  TempSrc: Oral Axillary Axillary Axillary  Resp: 24 22 22 24   Height:  5' 1.81" (1.57 m)    Weight:  48.762 kg (107 lb 8 oz)    SpO2: 93% 95% 93% 95%    Intake/Output Summary (Last 24 hours) at 05/08/12 1110 Last data filed at 05/08/12 0900  Gross per 24 hour  Intake    470 ml  Output      0 ml  Net    470 ml   Filed Weights   05/05/12 1943 05/06/12 1300 05/07/12 2224  Weight: 48.626 kg (107 lb 3.2 oz) 48.6 kg (107 lb 2.3 oz) 48.762 kg (107 lb 8 oz)    Exam:   General:  Speaks few words  Cardiovascular: rrr  Respiratory: no wheezing, decreased effort  Abdomen: +BS, soft, NT  Musculoskeletal: moves all 4 ext   Data Reviewed: Basic Metabolic Panel:  Recent Labs Lab 05/04/12 1600  NA 140  K 3.7  CL 105  CO2 26  GLUCOSE 118*  BUN 16  CREATININE 0.75  CALCIUM 8.9   Liver Function Tests: No results found for this basename: AST, ALT, ALKPHOS, BILITOT, PROT, ALBUMIN,   in the last 168 hours No results found for this basename: LIPASE, AMYLASE,  in the last 168 hours No results found for this basename: AMMONIA,  in the last 168 hours CBC:  Recent Labs Lab 05/04/12 1600  WBC 8.7  NEUTROABS 6.5  HGB 11.7*  HCT 35.2*  MCV 91.0  PLT 278   Cardiac Enzymes: No results found for this basename: CKTOTAL, CKMB, CKMBINDEX, TROPONINI,  in the last 168 hours BNP (last 3 results) No results found for this basename: PROBNP,  in the last 8760 hours CBG: No results found for this basename: GLUCAP,  in the last 168 hours  No results found for this or any previous visit (from the past 240 hour(s)).   Studies: Dg Chest Port 1 View  05/06/2012  *RADIOLOGY REPORT*  Clinical Data: 79 year old female with cough, congestion.  Fever. Choking.  Possible aspiration.  PORTABLE CHEST - 1 VIEW  Comparison: 05/04/2012 and earlier.  Findings: Portable semi upright AP view at 1255 hours.  Larger lung volumes.  Stable cardiac size and mediastinal contours.  No pneumothorax or pulmonary edema.  No consolidation or definite effusion.  Minimal bibasilar opacity appears stable to 12/22/2011, favor atelectasis or scarring.  No acute pulmonary opacity identified.  IMPRESSION: No acute cardiopulmonary abnormality.   Original Report Authenticated By: Odessa Fleming III,  M.D.     Scheduled Meds: . ciprofloxacin  1 drop Left Eye Q8H  . clonazePAM  0.25 mg Oral Q12H  . donepezil  10 mg Oral QHS  . enoxaparin (LOVENOX) injection  30 mg Subcutaneous Q24H  . lamoTRIgine  50 mg Oral BID  . levothyroxine  112 mcg Oral QAC breakfast  . memantine  10 mg Oral Daily  . OLANZapine  2.5 mg Oral BID  . piperacillin-tazobactam (ZOSYN)  IV  3.375 g Intravenous Q8H  . prednisoLONE acetate  1 drop Left Eye BID  . vancomycin  1,000 mg Intravenous Q24H   Continuous Infusions:    Principal Problem:   Choking episode Active Problems:   HYPOTHYROIDISM   Dementia   Altered mental status    Time spent:  25    [facility], Unnamed Zeien  [facility] Pager [phone]. If 7PM-7AM, please contact night-coverage at [email or web address], password [facility] 05/08/2012, 11:10 AM  LOS: 4 days

## 2012-05-08 NOTE — Progress Notes (Signed)
Speech Language Pathology Dysphagia Treatment Patient Details Name: [name] MRN: [id number] DOB: [birth year] Today's Date: 05/08/2012 Time: 4098-1191 SLP Time Calculation (min): 8 min  Assessment / Plan / Recommendation Clinical Impression  Observed pt. being fed by RN tech.  Pt. on Dys 3 texture but texture of meat today resembled Dys 2 (chicken and dumplings).  She manipulated and masticted red velvet cake with mildly delayed prep and transit.  One episode of likely penetration/aspiration with milk due to acute presence of wet vocal quality followed by mildly delayed throat clear and delayed coughs.  Pt. followed by Palliative care and family deciding on comfort care.  Family has stated they do not want extra tests and desire pt. to be comfortable.  SLP will continue ro follow and may suggest downgrading of solids/liquids if s/s aspiration or difficulty masticating persist and family is in agreement.    Diet Recommendation  Continue with Current Diet: Dysphagia 3 (mechanical soft);Thin liquid    SLP Plan Continue with current plan of care   Pertinent Vitals/Pain none   Swallowing Goals  SLP Swallowing Goals Goal #3: Family will demonstrate comprehension of appropriate diet texture and swallow precautions with min verbal assist. Swallow Study Goal #3 - Progress: Progressing toward goal  General Temperature Spikes Noted: No Respiratory Status: Room air Behavior/Cognition: Lethargic;Confused;Requires cueing;Decreased sustained attention Patient Positioning: Upright in bed  Oral Cavity - Oral Hygiene Does patient have any of the following "at risk" factors?: Other - dysphagia;Nutritional status - dependent feeder Brush patient's teeth BID with toothbrush (using toothpaste with fluoride): Yes Patient is HIGH RISK - Oral Care Protocol followed (see row info): Yes   Dysphagia Treatment Treatment focused on: Skilled observation of diet tolerance Treatment Methods/Modalities:  Skilled observation Patient observed directly with PO's: Yes Type of PO's observed: Dysphagia 3 (soft);Thin liquids Feeding: Total assist Liquids provided via: Cup Oral Phase Signs & Symptoms: Anterior loss/spillage;Prolonged oral phase (mild) Pharyngeal Phase Signs & Symptoms: Suspected delayed swallow initiation;Audible swallow;Wet vocal quality;Delayed throat clear Type of cueing: Verbal;Visual;Tactile Amount of cueing: Maximal   GO     Breck Coons Leilanee Righetti M.Ed [employer] [phone]  05/08/2012

## 2012-05-09 DIAGNOSIS — J690 Pneumonitis due to inhalation of food and vomit: Secondary | ICD-10-CM | POA: Diagnosis present

## 2012-05-09 DIAGNOSIS — R1310 Dysphagia, unspecified: Secondary | ICD-10-CM | POA: Diagnosis present

## 2012-05-09 DIAGNOSIS — N182 Chronic kidney disease, stage 2 (mild): Secondary | ICD-10-CM

## 2012-05-09 MED ORDER — LEVOFLOXACIN IN D5W 750 MG/150ML IV SOLN
750.0000 mg | INTRAVENOUS | Status: DC
  Administered 2012-05-11: 750 mg via INTRAVENOUS
  Filled 2012-05-09: qty 150

## 2012-05-09 MED ORDER — HALOPERIDOL LACTATE 5 MG/ML IJ SOLN: 1.0000 mg | Freq: Four times a day (QID) | INTRAMUSCULAR | Status: DC | PRN

## 2012-05-09 MED ORDER — LORAZEPAM 2 MG/ML IJ SOLN: 1.0000 mg | INTRAMUSCULAR | Status: DC | PRN

## 2012-05-09 MED ORDER — LEVOFLOXACIN IN D5W 750 MG/150ML IV SOLN
750.0000 mg | INTRAVENOUS | Status: DC
  Administered 2012-05-09: 750 mg via INTRAVENOUS
  Filled 2012-05-09: qty 150

## 2012-05-09 NOTE — Progress Notes (Addendum)
[facility] PROGRESS NOTE  [name] NWG:[id number] DOB: 11/25/1930 DOA: 05/04/2012 PCP: Rogelia Boga, MD  Assessment/Plan: 1. Choking episode at the memory care unit - probably patient has esophageal dysmotility. Bedside swallow evaluation was not indicative of pharyngeal dysphagia. Patient did well on- DYS  3 diet- ? CVA discussed MRI with family- do not wish to pursue. With patient's severe dementia, family is interested in palliative care and hospice- mental status waxes and wanes- 2. 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 We discontinued all meds on 4/23 and monitor for response  3. Aspiration pneumonia - fever out of proportion to the CXR findings. Completed 4 days of iv Vanc and Zosyn . 3 more days of levaquin started on 4/23   Code Status: DNR Family Communication: daughter Disposition Plan: hospice vs snf   Consultants:  palliative  Procedures:  none  Antibiotics:  Vanc and zosyn 4/20-4/23  - levaquin 4/23  HPI/Subjective: Sedated this am  Objective: Filed Vitals:   05/08/12 1721 05/08/12 2048 05/09/12 0419 05/09/12 1000  BP: 106/50 104/51 98/46 113/51  Pulse: 92 83 80 75  Temp: 98.6 F (37 C) 99.7 F (37.6 C) 98.1 F (36.7 C) 98 F (36.7 C)  TempSrc: Oral Axillary Axillary Axillary  Resp: 22 22 20 22   Height:  5' 1.81" (1.57 m)    Weight:   48.127 kg (106 lb 1.6 oz)   SpO2: 96% 94% 96% 98%   Patient Vitals for the past 24 hrs:  BP Temp Temp src Pulse Resp SpO2 Height Weight  05/09/12 1000 113/51 mmHg 98 F (36.7 C) Axillary 75 22 98 % - -  05/09/12 0419 98/46 mmHg 98.1 F (36.7 C) Axillary 80 20 96 % - 48.127 kg (106 lb 1.6 oz)  05/08/12 2048 104/51 mmHg 99.7 F (37.6 C) Axillary 83 22 94 % 5' 1.81" (1.57 m) -  05/08/12 1721 106/50 mmHg 98.6 F (37 C) Oral 92 22 96 % - -     Intake/Output Summary (Last 24 hours) at 05/09/12  1219 Last data filed at 05/09/12 0900  Gross per 24 hour  Intake    270 ml  Output      0 ml  Net    270 ml   Filed Weights   05/06/12 1300 05/07/12 2224 05/09/12 0419  Weight: 48.6 kg (107 lb 2.3 oz) 48.762 kg (107 lb 8 oz) 48.127 kg (106 lb 1.6 oz)    Exam:   General:  Asleep   Cardiovascular: rrr  Respiratory: no wheezing, decreased effort  Abdomen: +BS, soft, NT  Musculoskeletal: moves all 4 ext   Data Reviewed: Basic Metabolic Panel:  Recent Labs Lab 05/04/12 1600  NA 140  K 3.7  CL 105  CO2 26  GLUCOSE 118*  BUN 16  CREATININE 0.75  CALCIUM 8.9   Liver Function Tests: No results found for this basename: AST, ALT, ALKPHOS, BILITOT, PROT, ALBUMIN,  in the last 168 hours No results found for this basename: LIPASE, AMYLASE,  in the last 168 hours No results found for this basename: AMMONIA,  in the last 168 hours CBC:  Recent Labs Lab 05/04/12 1600  WBC 8.7  NEUTROABS 6.5  HGB 11.7*  HCT 35.2*  MCV 91.0  PLT 278   Cardiac Enzymes: No results found for this basename: CKTOTAL, CKMB, CKMBINDEX, TROPONINI,  in the last 168 hours BNP (last 3 results) No results found for this basename:  PROBNP,  in the last 8760 hours CBG: No results found for this basename: GLUCAP,  in the last 168 hours  No results found for this or any previous visit (from the past 240 hour(s)).   Studies: No results found.  Scheduled Meds: . ciprofloxacin  1 drop Left Eye Q8H  . clonazePAM  0.25 mg Oral Q12H  . enoxaparin (LOVENOX) injection  30 mg Subcutaneous Q24H  . levofloxacin  750 mg Intravenous Q24H  . levothyroxine  112 mcg Oral QAC breakfast  . prednisoLONE acetate  1 drop Left Eye BID   Continuous Infusions:    Principal Problem:   Dysphagia, unspecified Active Problems:   HYPOTHYROIDISM   OSTEOPOROSIS   Dementia   Altered mental status   CKD (chronic kidney disease), stage II   Choking episode   Aspiration pneumonia    [name]  [facility] Pager [phone]. If 7PM-7AM, please contact night-coverage at [email or web address], password [facility] 05/09/2012, 12:19 PM  LOS: 5 days

## 2012-05-09 NOTE — Progress Notes (Signed)
Assessment of DTI to sacrum completed. There is a 3.5x2.5x0.2 purple area to the sacrum, with a small open area in the middle of the wound. The open area is 0.5x0.5x0.2, pink in the center of the wound. WOC consult requested from MD. Plan to turn q2h. Skin care protocol initiated.

## 2012-05-09 NOTE — Progress Notes (Signed)
Patient [name]      DOB: October 10, 1930      HQI:[id number]   Palliative Medicine Team at [facility] Progress Note    Subjective:  Patient more awake , talking, eating some.  Looking improved since yesterday. Still low grade temp.  Not eating well.  Met family at bedside.  Daugher states that if she is improving would like to complete standard course of antibiotics and not considering resuming for future infections.  MOST from completed and placed on chart.  Provided notes for the airline as they will be missing two trips.  Plan is to find Skilled level of care and transition there with PCS unless she declines again then residential hospice is most appropriate.     Filed Vitals:   05/08/12 2048  BP: 104/51  Pulse: 83  Temp: 99.7 F (37.6 C)  Resp: 22   Physical exam:  Gen. able to communicate even periods of funny expression Pupils are not reactive to light she has an infection in the left eye and is blind bilaterally Chest anteriorly with cough shortness rattling but on exam has decreased breath sounds without rhonchi rales or wheezes Cardiovascular regular rate and rhythm positive S1 and S2 no S3-S4 is a 3/6 systolic ejection murmur Abdomen is soft no grimace Extremities tendency not to use her right hand as much as the left but improved from admission she is no edema mottling or lesions Neurologically the patient is not oriented to time place or person but can't formulate some sentences and answers to questions. She does not have capacity for decision making     Assessment and plan: Patient is a 79 year old white female with a fast score stage VII dementia who presented after choking on her lunch resulting in likely an aspiration pneumonia despite her x-ray findings light HEENT from her physical exam. She has defervesced on antibiotic therapy broad spectrum. Today I met with her daughter and she has decided to complete a course of antibiotics by mouth to IV converted to  oral but not initiate antibiotics ever again. Her goals at discharge would be to an appropriate location where the patient could be cared for and not return to the hospital. We have initiated a skilled nursing facility search however she continues to do poorly residential hospice definitely would be appropriate and the daughter would be open to this. She cared for her father at [facility] many years ago. I've completed a most form and provided letters excusing Gunnar Fusi and Joe from their trips in the next week to two weeks.   1.  DO NOT RESUSCITATE intubate 2. Fever continued treatment with antibiotics, converted to oral at the discretion of the primary service, continue when necessary Tylenol 3. Dysphasia speech language is working with her diet with her  Prognosis is overall poor and her daughter understands this she will be seeking full comfort at the time of discharge but feel she needs to complete an antibiotic course since she initiated  Disposition to be determined. Ideally this patient is not eating or drinking should be referred to a residential hospice setting and status left but at this time she is eating with assistance, and her family is still in partial treatment mode.   Total time 40 minutes. Greater than 50% of the time was counseling and coordination preparation of documents and review of goals of care.    Leshia Kope L. Ladona Ridgel, MD MBA The Palliative Medicine Team at [facility] Phone: [phone] Pager: [phone]

## 2012-05-10 MED ORDER — ALPRAZOLAM 0.25 MG PO TABS: 0.2500 mg | ORAL_TABLET | Freq: Three times a day (TID) | ORAL | Status: DC | PRN

## 2012-05-10 MED ORDER — OLANZAPINE 2.5 MG PO TABS: 2.5000 mg | ORAL_TABLET | Freq: Two times a day (BID) | ORAL | Status: AC | PRN

## 2012-05-10 MED ORDER — ARTIFICIAL TEARS OP OINT
TOPICAL_OINTMENT | Freq: Two times a day (BID) | OPHTHALMIC | Status: DC
  Administered 2012-05-10 – 2012-05-11 (×2): via OPHTHALMIC
  Filled 2012-05-10: qty 3.5

## 2012-05-10 MED ORDER — CLONAZEPAM 0.5 MG PO TABS: 0.2500 mg | ORAL_TABLET | Freq: Two times a day (BID) | ORAL | Status: DC

## 2012-05-10 NOTE — Progress Notes (Signed)
Palliative Care Team at [facility] Progress Note   SUBJECTIVE: Patient resting comfortably, does not respond to my questions appropriately ie. When I asked her where she was she said "I love you honey". And also had difficult to understand speech. She did not track well with her eyes, they seemed to be very dry.  OBJECTIVE: Vital Signs: BP 113/55  Pulse 72  Temp(Src) 97.7 F (36.5 C) (Oral)  Resp 16  Ht 5' 1.81" (1.57 m)  Wt 48.7 kg (107 lb 5.8 oz)  BMI 19.76 kg/m2  SpO2 99%   Intake and Output: 04/23 0701 - 04/24 0700 In: 470 [P.O.:320; IV Piggyback:150] Out: -   Physical Exam: General: No acute distress.  Head: Normocephalic, atraumatic.  Lungs:  Normal respiratory effort. Clear to auscultation BL without crackles or wheezes.  Heart: RRR. S1 and S2 normal without gallop,  or rubs. (+) murmur  Abdomen:  BS normoactive. Soft, Nondistended, non-tender.  No masses or organomegaly.  Extremities: No pretibial edema.    Allergies  Allergen Reactions  . Latex Other (See Comments)    Swelling to area of contact  . Codeine Nausea And Vomiting    Medications: Scheduled Meds:  . artificial tears   Both Eyes BID  . ciprofloxacin  1 drop Left Eye Q8H  . clonazePAM  0.25 mg Oral Q12H  . enoxaparin (LOVENOX) injection  30 mg Subcutaneous Q24H  . [START ON 05/11/2012] levofloxacin  750 mg Intravenous Q48H  . levothyroxine  112 mcg Oral QAC breakfast  . prednisoLONE acetate  1 drop Left Eye BID    Continuous Infusions:    PRN Meds: acetaminophen, acetaminophen, ALPRAZolam, haloperidol lactate, ondansetron (ZOFRAN) IV, ondansetron  Stool Softner: yes  Palliative Performance Scale: 30 %   Pain Present?: no   If yes: Pain Score: 0/10 Pain Location: 0   Labs: CBC    Component Value Date/Time   WBC 8.7 05/04/2012 1600   RBC 3.87 05/04/2012 1600   HGB 11.7* 05/04/2012 1600   HCT 35.2* 05/04/2012 1600   PLT 278 05/04/2012 1600   MCV 91.0 05/04/2012 1600   MCH 30.2  05/04/2012 1600   MCHC 33.2 05/04/2012 1600   RDW 12.8 05/04/2012 1600   LYMPHSABS 1.3 05/04/2012 1600   MONOABS 0.8 05/04/2012 1600   EOSABS 0.0 05/04/2012 1600   BASOSABS 0.0 05/04/2012 1600    CMET     Component Value Date/Time   NA 140 05/04/2012 1600   K 3.7 05/04/2012 1600   CL 105 05/04/2012 1600   CO2 26 05/04/2012 1600   GLUCOSE 118* 05/04/2012 1600   BUN 16 05/04/2012 1600   CREATININE 0.75 05/04/2012 1600   CALCIUM 8.9 05/04/2012 1600   PROT 7.1 12/22/2011 1635   ALBUMIN 3.4* 12/22/2011 1635   AST 21 12/22/2011 1635   ALT 14 12/22/2011 1635   ALKPHOS 76 12/22/2011 1635   BILITOT 0.2* 12/22/2011 1635   GFRNONAA 78* 05/04/2012 1600   GFRAA >90 05/04/2012 1600     Imaging: Chest Xray Reviewed/ Impressions: reviewed  CT scan of the Head Reviewed/Impressions: reveiwed   ASSESSMENT/ PLAN:  79 yo with advanced dementia and CVD, I updated and spoke with her daughter Gunnar Fusi at length today. Paual says she is at her baseline now, Gunnar Fusi is amazed by the "medical recovery". They are looking at SNF options. She has medicaid, they want and need a facility that can provide hospice care, I doubt she will be able to rehab for very long.Daughter says that current  memory care will not take her back in her current condition. CSW asisting with [placement, she is stable for discharge. She can go to SNF with full hospice potentiall since she has medicaid -most form in place, daughter able to clearly articulate future goals for full comfort care, no re-hospitalization. Time: 35 minutes  Greater than 50%  of this time was spent counseling and coordinating care related to the above assessment and plan.   Edsel Petrin, DO  05/10/2012, 10:32 PM  Please contact Palliative Medicine Team phone at [phone] for questions and concerns.

## 2012-05-10 NOTE — Progress Notes (Signed)
Chaplain Note: Found pt resting. No family at bedside. Will follow up tomorrow.  Prayed for pt.  Rutherford Nail Chaplain Resident [phone]

## 2012-05-10 NOTE — Progress Notes (Signed)
Speech Language Pathology Dysphagia Treatment Patient Details Name: [name] MRN: [id number] DOB: 1930/03/03 Today's Date: 05/10/2012 Time: 1010-1017 SLP Time Calculation (min): 7 min  Assessment / Plan / Recommendation Clinical Impression  Dysphagia treatment with pt. who is alert and communicative with therapist.  Pt. consumed cup sips juice with evidence of mildly decreased/delayed bolus coordination that did not appear to affect pt.'s function.  Pharyngeal phase was not significant for impairments with liquid (in contrast session 4/22) to or Dys 3 texture.  Pt.'s oral prep, mastication and transit with cracker was functional.  Pt.'s performance has been inconsistent therefore would like to see her once more for diet/liquid tolerance.     Diet Recommendation  Continue with Current Diet: Dysphagia 3 (mechanical soft);Thin liquid    SLP Plan Continue with current plan of care   Pertinent Vitals/Pain none   Swallowing Goals  SLP Swallowing Goals Patient will utilize recommended strategies during swallow to increase swallowing safety with: Maximal cueing Swallow Study Goal #2 - Progress: Progressing toward goal Swallow Study Goal #3 - Progress: Not met  General Temperature Spikes Noted: No Respiratory Status: Room air Behavior/Cognition: Alert;Cooperative;Pleasant mood;Requires cueing Oral Cavity - Dentition: Adequate natural dentition Patient Positioning: Upright in bed  Oral Cavity - Oral Hygiene Does patient have any of the following "at risk" factors?: Nutritional status - dependent feeder Brush patient's teeth BID with toothbrush (using toothpaste with fluoride): Yes Patient is HIGH RISK - Oral Care Protocol followed (see row info): Yes   Dysphagia Treatment Treatment focused on: Skilled observation of diet tolerance Treatment Methods/Modalities: Skilled observation Patient observed directly with PO's: Yes Type of PO's observed: Dysphagia 3 (soft);Thin  liquids Feeding: Needs assist;Needs set up Liquids provided via: Cup Oral Phase Signs & Symptoms:  (mild decreased coordination) Type of cueing: Verbal;Visual;Tactile Amount of cueing: Moderate   GO     Breck Coons [place].[employer] [phone]   05/10/2012

## 2012-05-10 NOTE — Progress Notes (Addendum)
[facility] PROGRESS NOTE  JANEI SCHEFF ZOX:[id number] DOB: 1930/02/28 DOA: 05/04/2012 PCP: Rogelia Boga, MD  Assessment/Plan: 1. Choking episode at the memory care unit - probably patient has esophageal dysmotility. Bedside swallow evaluation was not indicative of pharyngeal dysphagia. Patient did well on- DYS  3 diet- ? CVA discussed MRI with family- do not wish to pursue. With patient's severe dementia, family is interested in palliative care and hospice- mental status waxes and wanes- 2. 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 We discontinued all meds on 4/23 and patient is more awake  3. Aspiration pneumonia - fever out of proportion to the CXR findings. Completed 4 days of iv Vanc and Zosyn . 3 more days of levaquin started on 4/23   Code Status: DNR Family Communication: daughter Disposition Plan: snf    Consultants:  palliative  Procedures:  none  Antibiotics:  Vanc and zosyn 4/20-4/23  - levaquin 4/23  HPI/Subjective: Sedated this am  Objective: Filed Vitals:   05/09/12 1000 05/09/12 2200 05/10/12 0615 05/10/12 0800  BP: 113/51 120/50 111/55 104/61  Pulse: 75 80 83 77  Temp: 98 F (36.7 C) 98.5 F (36.9 C) 97.9 F (36.6 C) 97.2 F (36.2 C)  TempSrc: Axillary Axillary Oral Oral  Resp: 22 20 20 20   Height:      Weight:  48.7 kg (107 lb 5.8 oz)    SpO2: 98% 98% 93% 95%   Patient Vitals for the past 24 hrs:  BP Temp Temp src Pulse Resp SpO2 Weight  05/10/12 0800 104/61 mmHg 97.2 F (36.2 C) Oral 77 20 95 % -  05/10/12 0615 111/55 mmHg 97.9 F (36.6 C) Oral 83 20 93 % -  05/09/12 2200 120/50 mmHg 98.5 F (36.9 C) Axillary 80 20 98 % 48.7 kg (107 lb 5.8 oz)  05/09/12 1000 113/51 mmHg 98 F (36.7 C) Axillary 75 22 98 % -     Intake/Output Summary (Last 24 hours) at 05/10/12 0947 Last data filed at 05/10/12 0815  Gross per 24 hour  Intake     710 ml  Output      0 ml  Net    710 ml   Filed Weights   05/07/12 2224 05/09/12 0419 05/09/12 2200  Weight: 48.762 kg (107 lb 8 oz) 48.127 kg (106 lb 1.6 oz) 48.7 kg (107 lb 5.8 oz)    Exam:   General:  Awake, mumbles   Cardiovascular: rrr  Respiratory: no wheezing, decreased effort  Abdomen: +BS, soft, NT  Musculoskeletal: moves all 4 ext   Data Reviewed: Basic Metabolic Panel:  Recent Labs Lab 05/04/12 1600  NA 140  K 3.7  CL 105  CO2 26  GLUCOSE 118*  BUN 16  CREATININE 0.75  CALCIUM 8.9   Liver Function Tests: No results found for this basename: AST, ALT, ALKPHOS, BILITOT, PROT, ALBUMIN,  in the last 168 hours No results found for this basename: LIPASE, AMYLASE,  in the last 168 hours No results found for this basename: AMMONIA,  in the last 168 hours CBC:  Recent Labs Lab 05/04/12 1600  WBC 8.7  NEUTROABS 6.5  HGB 11.7*  HCT 35.2*  MCV 91.0  PLT 278   Cardiac Enzymes: No results found for this basename: CKTOTAL, CKMB, CKMBINDEX, TROPONINI,  in the last 168 hours BNP (last 3 results) No results found for this basename: PROBNP,  in the last 8760 hours CBG: No results  found for this basename: GLUCAP,  in the last 168 hours  No results found for this or any previous visit (from the past 240 hour(s)).   Studies: No results found.  Scheduled Meds: . ciprofloxacin  1 drop Left Eye Q8H  . clonazePAM  0.25 mg Oral Q12H  . enoxaparin (LOVENOX) injection  30 mg Subcutaneous Q24H  . [START ON 05/11/2012] levofloxacin  750 mg Intravenous Q48H  . levothyroxine  112 mcg Oral QAC breakfast  . prednisoLONE acetate  1 drop Left Eye BID   Continuous Infusions:    Principal Problem:   Dysphagia, unspecified Active Problems:   HYPOTHYROIDISM   OSTEOPOROSIS   Dementia   Altered mental status   CKD (chronic kidney disease), stage II   Choking episode   Aspiration pneumonia    Gizell Danser  [facility] Pager [phone]. If 7PM-7AM, please  contact night-coverage at [email or web address], password [facility] 05/10/2012, 9:47 AM  LOS: 6 days

## 2012-05-10 NOTE — Clinical Social Work Placement (Addendum)
Clinical Social Work Department CLINICAL SOCIAL WORK PLACEMENT NOTE 05/10/2012  Patient:  [name], [name]  Account Number:  [id number] Admit date:  05/04/2012  Clinical Social Worker:  Genelle Bal, LCSW  Date/time:  05/10/2012 06:59 AM  Clinical Social Work is seeking post-discharge placement for this patient at the following level of care:   SKILLED NURSING   (*CSW will update this form in Epic as items are completed)     Patient/family provided with [facility] Department of Clinical Social Work's list of facilities offering this level of care within the geographic area requested by the patient (or if unable, by the patient's family).  05/08/2012  Patient/family informed of their freedom to choose among providers that offer the needed level of care, that participate in Medicare, Medicaid or managed care program needed by the patient, have an available bed and are willing to accept the patient.    Patient/family informed of [facility]' ownership interest in [facility], as well as of the fact that they are under no obligation to receive care at this facility.  PASARR submitted to EDS on 03/15/2012 PASARR number received from EDS on 03/15/2012 - [phone] [id number]  FL2 transmitted to all facilities in geographic area requested by pt/family on  05/10/2012 FL2 transmitted to all facilities within larger geographic area on 05/10/12  Patient informed that his/her managed care company has contracts with or will negotiate with  certain facilities, including the following:     Patient/family informed of bed offers received:  05/10/2012 Patient chooses bed at [facility]  Physician recommends and patient chooses bed at  n/a  Patient to be transferred to [facility] on 05/11/2012   Patient to be transferred to facility by [facility]  The following physician request were entered in Epic:   Additional Comments:  Placement note amended by Vickii Penna, LCSWA [phone]

## 2012-05-10 NOTE — Clinical Social Work Note (Signed)
CSW talked with patient's daughter Gerilyn Nestle and her husband several times today (in patient's room and by phone [phone]) regarding SNF placement. Daughter requested patient's information be sent to [place], [place], [place], [place], [place], and [place]. Mrs. Mayford Knife is interested in a facility with a memory care, an available Medicaid bed, and willing to accept patient with hospice services. Daughter given bed offers and is interested in [place], [address], [place] and [place].   CSW contacted Marylene Land, admissions director with [facility] and they have a Medicaid bed in their LTC unit. Their memory care if for person who wander and admissions director does not feel patient appropriate for their memory care. They do accept patient with Hospice services. Daughter updated and subsequently contacted admissions director and was going to facility to tour. CSW contacted Tresa Endo, Insurance claims handler at [facility] and they do not have any Medicaid beds available. Their memory care is also for 'true' wanderers. Daughter contacted and updated. CSW also contacted [facility] and [facility] and messages left for admissions staff to return call.  Covering CSW will follow-up with [facility] and [facility] and with daughter to get final SNF decision. Daughter aware that patient is ready for discharge and a decision needed by Friday so that patient can d/c same date.  Genelle Bal, MSW, LCSW [phone]

## 2012-05-10 NOTE — Discharge Summary (Addendum)
Physician Discharge Summary  [name]:[id number] DOB: 1930-12-14 DOA: 05/04/2012  PCP: Rogelia Boga, MD  Admit date: 05/04/2012 Discharge date: 05/11/2012  Time spent: 45 minutes  Recommendations for Outpatient Follow-up:  Patient must have palliative care services - hospice following at SNF - to implement comfort measures MOST form wishes should situation require   Discharge Diagnoses:  Dysphagia, unspecified - may be from presbyesophagus    HYPOTHYROIDISM   OSTEOPOROSIS Vascular   Dementia   Altered mental status   CKD (chronic kidney disease), stage II   Choking episode   Aspiration pneumonia Deep tissue sacral decub    Discharge Condition: fair  Diet recommendation: dysphagia 3  Filed Weights   05/07/12 2224 05/09/12 0419 05/09/12 2200  Weight: 48.762 kg (107 lb 8 oz) 48.127 kg (106 lb 1.6 oz) 48.7 kg (107 lb 5.8 oz)    History of present illness:   [name] is a 79 y.o. female  With advanced dementia- from a memory care unit. At baseline patient speaks but talks nonsensically. 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 Today she had an episode of choking and then vomiting afterwards. She was less responsive than her baseline after that. She was brought to the ER for evaluation. Work up in the ER U/A, chest x ray was unrevealing. Family expressed interest in hospice care- either in a facility or inpatient residential.  Long discussion with family and they do not want to have an IV placed or have further investigation of AMS- Patient is very close back to her baseline. Family would like patient to be made comfortable. We will feed patient with known aspiration risks. Family knows she could be having seizure/TIA but only want things to be treated that interfere with her comfort  Social work has seen in [employer] and palliative care will see on floor  Hospital Course:  1. Choking episode at the memory care unit - probably patient has esophageal dysmotility. Bedside  swallow evaluation was not indicative of pharyngeal dysphagia. Patient did well on- DYS 3 diet- ? CVA discussed MRI with family- do not wish to pursue. With patient's severe dementia, family is interested in palliative care and hospice- mental status waxes and wanes- 2. 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 We discontinued all meds on 4/23 and patient is more awake by 4/24 - she is able to take small bites. Would continue comfort feeds with assistance and comfort care  3. Aspiration pneumonia - fever out of proportion to the CXR findings. Completed 4 days of iv Vanc and Zosyn .levaquin started on 4/23 - to end at discharge  4. Sacral decub - air mattress      Consultations:  Palliative care   Discharge Exam: Filed Vitals:   05/10/12 2106 05/11/12 0500 05/11/12 0519 05/11/12 1000  BP: 113/55  134/64 124/54  Pulse: 72  87 86  Temp: 97.7 F (36.5 C) 97 F (36.1 C) 97.7 F (36.5 C) 98 F (36.7 C)  TempSrc: Oral  Axillary Oral  Resp: 16  16 18   Height:      Weight:      SpO2: 99%  94% 96%    Discharge Instructions      Discharge Orders   Future Orders Complete By Expires     DIET DYS 3  As directed     Diet - low sodium heart healthy  As directed     Increase  activity slowly  As directed     Increase activity slowly  As directed         Medication List    STOP taking these medications       aspirin 81 MG chewable tablet     CALCIUM 600 + D PO     donepezil 10 MG tablet  Commonly known as:  ARICEPT     Fish Oil 1000 MG Caps     lamoTRIgine 25 MG tablet  Commonly known as:  LAMICTAL     memantine 10 MG tablet  Commonly known as:  NAMENDA     pravastatin 20 MG tablet  Commonly known as:  PRAVACHOL      TAKE these medications       acetaminophen 500 MG tablet  Commonly known as:  TYLENOL  Take 500 mg by mouth every 4 (four) hours as needed for pain or fever.      ALPRAZolam 0.25 MG tablet  Commonly known as:  XANAX  Take 1 tablet (0.25 mg total) by mouth 3 (three) times daily as needed for anxiety.     artificial tears Oint ophthalmic ointment  As needed     BESIVANCE 0.6 % Susp  Generic drug:  Besifloxacin HCl  Place 1 drop into the left eye every 8 (eight) hours.     beta carotene w/minerals tablet  Take 1 tablet by mouth daily.     Cholecalciferol 1000 UNITS capsule  Take 1,000 Units by mouth daily.     clonazePAM 0.5 MG tablet  Commonly known as:  KLONOPIN  Take 0.5 tablets (0.25 mg total) by mouth every 12 (twelve) hours.     levothyroxine 112 MCG tablet  Commonly known as:  SYNTHROID, LEVOTHROID  Take 112 mcg by mouth daily before breakfast.     moxifloxacin 0.5 % ophthalmic solution  Commonly known as:  VIGAMOX  1 drop 3 (three) times daily.     OLANZapine 2.5 MG tablet  Commonly known as:  ZYPREXA  Take 1 tablet (2.5 mg total) by mouth 2 (two) times daily as needed (agitation).     prednisoLONE acetate 1 % ophthalmic suspension  Commonly known as:  PRED FORTE  Place 1 drop into the left eye 2 (two) times daily.       Follow-up Information   Follow up with Rogelia Boga, MD.   Contact information:   [address] [place] [address] [phone]        The results of significant diagnostics from this hospitalization (including imaging, microbiology, ancillary and laboratory) are listed below for reference.    Significant Diagnostic Studies: Dg Chest 2 View  05/04/2012  *RADIOLOGY REPORT*  Clinical Data: Aspiration  CHEST - 2 VIEW  Comparison: 12/22/2011  Findings: Cardiomediastinal silhouette is stable.  Again noted osteopenia and mild degenerative changes thoracolumbar spine.  No acute infiltrate or pulmonary edema.  Stable bilateral basilar atelectasis or scarring.  IMPRESSION: No acute infiltrate or pulmonary edema.  Stable bilateral basilar atelectasis or scarring.   Original Report  Authenticated By: Natasha Mead, M.D.    Dg Chest Port 1 View  05/06/2012  *RADIOLOGY REPORT*  Clinical Data: 79 year old female with cough, congestion.  Fever. Choking.  Possible aspiration.  PORTABLE CHEST - 1 VIEW  Comparison: 05/04/2012 and earlier.  Findings: Portable semi upright AP view at 1255 hours.  Larger lung volumes.  Stable cardiac size and mediastinal contours.  No pneumothorax or pulmonary edema.  No consolidation or definite effusion.  Minimal  bibasilar opacity appears stable to 12/22/2011, favor atelectasis or scarring.  No acute pulmonary opacity identified.  IMPRESSION: No acute cardiopulmonary abnormality.   Original Report Authenticated By: Erskine Speed, M.D.     Microbiology: No results found for this or any previous visit (from the past 240 hour(s)).   Labs: Basic Metabolic Panel:  Recent Labs Lab 05/04/12 1600  NA 140  K 3.7  CL 105  CO2 26  GLUCOSE 118*  BUN 16  CREATININE 0.75  CALCIUM 8.9   Liver Function Tests: No results found for this basename: AST, ALT, ALKPHOS, BILITOT, PROT, ALBUMIN,  in the last 168 hours No results found for this basename: LIPASE, AMYLASE,  in the last 168 hours No results found for this basename: AMMONIA,  in the last 168 hours CBC:  Recent Labs Lab 05/04/12 1600  WBC 8.7  NEUTROABS 6.5  HGB 11.7*  HCT 35.2*  MCV 91.0  PLT 278   Cardiac Enzymes: No results found for this basename: CKTOTAL, CKMB, CKMBINDEX, TROPONINI,  in the last 168 hours BNP: BNP (last 3 results) No results found for this basename: PROBNP,  in the last 8760 hours CBG: No results found for this basename: GLUCAP,  in the last 168 hours     Signed:  Marzella Miracle  [facility] 05/11/2012, 1:50 PM

## 2012-05-10 NOTE — Consult Note (Signed)
WOC consult Note Reason for Consult: evaluation of the sacrum, new pressure ulcer. Pt with some confusion, and garbled speech while I am at the bedside. Wound type: DTI of the sacrum, just at the edge of the gluteal skin folds Pressure Ulcer POA: No Measurement:3.5cm x 2.0cm x 0 Wound bed: dark, maroon tissue, not open at this time. May evolve to open wound, will monitor Drainage (amount, consistency, odor) none Periwound:blanchable Dressing procedure/placement/frequency: add silicone foam dressing, turn and reposition at least every 2 hours, add air mattress for pressure redistribution.   Re consult if needed, will not follow at this time. Thanks  Emmauel Hallums [employer], CWOCN [phone])

## 2012-05-11 MED ORDER — ARTIFICIAL TEARS OP OINT: TOPICAL_OINTMENT | OPHTHALMIC | Status: AC

## 2012-05-11 NOTE — Clinical Social Work Note (Signed)
CSW contacted daughter, Gerilyn Nestle at [phone] to discuss choice of bed offers.  Pt/dtr was given bed offers from [facility], [place] and [address].  CSW is awaiting a call from the daughter to determine bed choice.   CSW will continue to follow and assist.  Vickii Penna, LCSWA [phone]  Clinical Social Work

## 2012-05-11 NOTE — Evaluation (Signed)
Physical Therapy Evaluation Patient Details Name: [name] MRN: [id number] DOB: 05-29-1930 Today's Date: 05/11/2012 Time: 1210-1228 PT Time Calculation (min): 18 min  PT Assessment / Plan / Recommendation Clinical Impression  Pt is a 79 y/o female With advanced dementia- from a memory care unit. At baseline patient speaks but talks nonsensically. 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 Pt on comfort care and not an appropriate candidate for PT.  Acute PT signing off.     PT Assessment  Patent does not need any further PT services    Follow Up Recommendations  No PT follow up    Does the patient have the potential to tolerate intense rehabilitation      Barriers to Discharge        Equipment Recommendations  None recommended by PT    Recommendations for Other Services     Frequency      Precautions / Restrictions Precautions Precautions: Fall Restrictions Weight Bearing Restrictions: No   Pertinent Vitals/Pain No c/o pain.        Mobility  Bed Mobility Bed Mobility: Supine to Sit;Sitting - Scoot to Edge of Bed Supine to Sit: 1: +2 Total assist;HOB flat Supine to Sit: Patient Percentage: 0% Sitting - Scoot to Edge of Bed: 1: +2 Total assist Sitting - Scoot to Edge of Bed: Patient Percentage: 0% Details for Bed Mobility Assistance: total assist to sit pt up from supine using  draw pad in bed.   Transfers Transfers: Sit to Stand;Stand to Sit;Stand Pivot Transfers Sit to Stand: 1: +2 Total assist;From bed;Without upper extremity assist Sit to Stand: Patient Percentage: 20% Stand to Sit: 1: +2 Total assist;To chair/3-in-1;Without upper extremity assist Stand to Sit: Patient Percentage: 20% Stand Pivot Transfers: 1: +2 Total assist Stand Pivot Transfers: Patient Percentage: 20% Details for Transfer Assistance: Pt able to support weight through her legs with knees and feet blocked.  Manual facilitation to shift wt forward to iniitiate bilateral knee extension.    Ambulation/Gait Ambulation/Gait Assistance: Not tested (comment)    Exercises     PT Diagnosis:    PT Problem List:   PT Treatment Interventions:     PT Goals Acute Rehab PT Goals PT Goal Formulation: Patient unable to participate in goal setting  Visit Information  Last PT Received On: 05/11/12    Subjective Data  Subjective: no verbal communication. Patient Stated Goal: Pt unable to state.    Prior Functioning  Home Living Available Help at Discharge: Skilled Nursing Facility (Palliative care per RN ) Communication Communication: Expressive difficulties    Cognition  Cognition Arousal/Alertness: Lethargic Behavior During Therapy: Flat affect Overall Cognitive Status: No family/caregiver present to determine baseline cognitive functioning    Extremity/Trunk Assessment Right Upper Extremity Assessment RUE ROM/Strength/Tone: Unable to fully assess Left Upper Extremity Assessment LUE ROM/Strength/Tone: Unable to fully assess Right Lower Extremity Assessment RLE ROM/Strength/Tone: Unable to fully assess Left Lower Extremity Assessment LLE ROM/Strength/Tone: Unable to fully assess;Due to impaired cognition   Balance Balance Balance Assessed: Yes Static Sitting Balance Static Sitting - Balance Support: Feet supported Static Sitting - Level of Assistance: 4: Min assist Static Sitting - Comment/# of Minutes: Pt able to maintain sitting on EOB with assist  for approximately one minute before falling backward onto bed.    End of Session PT - End of Session Equipment Utilized During Treatment: Gait belt Activity Tolerance: Patient tolerated treatment well Patient left: in chair Nurse Communication: Mobility status;Need for lift equipment  GP  Sharunda Salmon 05/11/2012, 2:44 PM Kursten Kruk L. Manya Balash DPT [phone]

## 2012-05-11 NOTE — Progress Notes (Signed)
Report given to nurse at [facility]. IV removed. Assessment unchanged from morning. Pt ready for transport to SNF.

## 2012-05-11 NOTE — Progress Notes (Signed)
OT Cancellation Note  Patient Details Name: [name] MRN: [id number] DOB: [birth year]   Cancelled Treatment:     Pt to D/C to SNF today. Will defer OT eval to that facility.  Evette Georges [phone] 05/11/2012, 2:29 PM

## 2012-05-11 NOTE — Clinical Social Work Note (Signed)
CSW confirmed with daughter that family chooses [place], [place].  CSW confirmed with Marylene Land at [facility] that pt is approved for admission today.  CSW will assist with arranging transportation needs to SNF.  Per discussion with unit RN, CSW to call transportation for around 2:45pm.    Vickii Penna, LCSWA [phone]  Clinical Social Work

## 2012-05-11 NOTE — Clinical Social Work Note (Signed)
CSW called [facility] for transportation.  CSW placed d/c packet along with Rx and DNR form in pt chart.  Vickii Penna, LCSWA [phone]  Clinical Social Work

## 2012-05-14 ENCOUNTER — Other Ambulatory Visit: Payer: Self-pay | Admitting: *Deleted

## 2012-05-14 MED ORDER — ALPRAZOLAM 0.25 MG PO TABS: ORAL_TABLET | ORAL | Status: AC

## 2012-05-15 ENCOUNTER — Other Ambulatory Visit: Payer: Self-pay | Admitting: *Deleted

## 2012-05-15 MED ORDER — MORPHINE SULFATE (CONCENTRATE) 20 MG/ML PO SOLN: ORAL | Status: DC

## 2012-05-18 ENCOUNTER — Non-Acute Institutional Stay (SKILLED_NURSING_FACILITY): Payer: Medicare Other | Admitting: Internal Medicine

## 2012-05-18 DIAGNOSIS — E039 Hypothyroidism, unspecified: Secondary | ICD-10-CM

## 2012-05-18 DIAGNOSIS — G309 Alzheimer's disease, unspecified: Secondary | ICD-10-CM

## 2012-05-18 DIAGNOSIS — F411 Generalized anxiety disorder: Secondary | ICD-10-CM

## 2012-05-18 DIAGNOSIS — N182 Chronic kidney disease, stage 2 (mild): Secondary | ICD-10-CM

## 2012-05-18 DIAGNOSIS — F0280 Dementia in other diseases classified elsewhere without behavioral disturbance: Secondary | ICD-10-CM

## 2012-05-23 ENCOUNTER — Non-Acute Institutional Stay (SKILLED_NURSING_FACILITY): Payer: Medicare Other | Admitting: Internal Medicine

## 2012-05-23 DIAGNOSIS — J9600 Acute respiratory failure, unspecified whether with hypoxia or hypercapnia: Secondary | ICD-10-CM

## 2012-05-23 DIAGNOSIS — J9601 Acute respiratory failure with hypoxia: Secondary | ICD-10-CM

## 2012-05-23 DIAGNOSIS — R1310 Dysphagia, unspecified: Secondary | ICD-10-CM

## 2012-05-23 DIAGNOSIS — F0390 Unspecified dementia without behavioral disturbance: Secondary | ICD-10-CM

## 2012-06-04 ENCOUNTER — Other Ambulatory Visit: Payer: Self-pay | Admitting: *Deleted

## 2012-06-04 ENCOUNTER — Non-Acute Institutional Stay (SKILLED_NURSING_FACILITY): Payer: Medicare Other | Admitting: Internal Medicine

## 2012-06-04 DIAGNOSIS — N182 Chronic kidney disease, stage 2 (mild): Secondary | ICD-10-CM | POA: Insufficient documentation

## 2012-06-04 DIAGNOSIS — R4182 Altered mental status, unspecified: Secondary | ICD-10-CM

## 2012-06-04 DIAGNOSIS — F411 Generalized anxiety disorder: Secondary | ICD-10-CM | POA: Insufficient documentation

## 2012-06-04 DIAGNOSIS — G309 Alzheimer's disease, unspecified: Secondary | ICD-10-CM | POA: Insufficient documentation

## 2012-06-04 MED ORDER — CLONAZEPAM 0.5 MG PO TABS: ORAL_TABLET | ORAL | Status: DC

## 2012-06-04 NOTE — Progress Notes (Signed)
Patient ID: [name], female   DOB: Dec 06, 1930, 79 y.o.   MRN: [id number]        HISTORY & PHYSICAL  DATE: 05/18/2012   FACILITY: [facility]  LEVEL OF CARE: SNF (31)  ALLERGIES:  Allergies  Allergen Reactions  . Latex Other (See Comments)    Swelling to area of contact  . Codeine Nausea And Vomiting    CHIEF COMPLAINT:  Manage dementia, hypothyroidism, chronic kidney disease stage II.    HISTORY OF PRESENT ILLNESS:  79 year-old, Caucasian female was hospitalized after a choking spell secondary to altered mental status.  After hospitalization, she is admitted to this facility for long-term care management.  She has the following problems:   DEMENTIA: The dementia remains stable and continues to function adequately in the current living environment with supervision.  The patient has had little changes in behavior. No complications noted from the medications presently being used.  Patient is a poor historian.  Family has elected comfort care measures.   HYPOTHYROIDISM: The hypothyroidism remains stable. No complications noted from the medications presently being used.  The staff denies fatigue or constipation.  Last TSH:  A recent TSH is not available.   CHRONIC KIDNEY DISEASE: The patient's chronic kidney disease remains stable.  Staff denies increasing lower extremity swelling or confusion. Last BUN and creatinine are:  BUN 68, creatinine 0.75.    PAST MEDICAL HISTORY :  Past Medical History  Diagnosis Date  . DIVERTICULOSIS, COLON 07/13/2007  . GERD 09/25/2008  . HYPERTENSION 12/09/2008  . HYPOTHYROIDISM 07/17/2006  . OSTEOARTHRITIS 11/16/2006  . OSTEOPOROSIS 07/17/2006  . Other and unspecified hyperlipidemia 11/19/2007  . OVERACTIVE BLADDER 06/08/2009  . RHUS DERMATITIS 08/10/2007  . Unspecified hearing loss 12/09/2008  . Unspecified urinary incontinence 09/02/2009  . Glaucoma     to left eye  . Dementia     PAST SURGICAL HISTORY: Past Surgical History   Procedure Laterality Date  . Abdominal hysterectomy    . Tonsillectomy    . Knee arthroscopy      right  . Corneal transplant      left  . Total knee arthroplasty      SOCIAL HISTORY:  reports that she quit smoking about 64 years ago. She has never used smokeless tobacco. She reports that she does not drink alcohol or use illicit drugs.  FAMILY HISTORY: none  CURRENT MEDICATIONS: Reviewed per [facility]  REVIEW OF SYSTEMS:  Unobtainable due to dementia.   PHYSICAL EXAMINATION  VS:  T 98      P 80      RR 17     BP 124/64       POX%        WT (Lb) 107  GENERAL: no acute distress, normal body habitus EYES: conjunctivae normal, sclerae normal, normal eye lids MOUTH/THROAT: poor dentition, lips without lesions,no lesions in the mouth,tongue is without lesions,uvula elevates in midline NECK: supple, trachea midline, no neck masses, no thyroid tenderness, no thyromegaly LYMPHATICS: no LAN in the neck, no supraclavicular LAN RESPIRATORY: breathing is even & unlabored, BS CTAB CARDIAC: RRR, no murmur,no extra heart sounds, no edema GI:  ABDOMEN: abdomen soft, normal BS, no masses, no tenderness  LIVER/SPLEEN: no hepatomegaly, no splenomegaly MUSCULOSKELETAL: unable to assess  PSYCHIATRIC: the patient is alert, disoriented, decreased affect and mood   LABS/RADIOLOGY: Urinalysis negative.   Chest x-ray:  Negative.    Glucose 118, otherwise BMP normal.    Hemoglobin 11.7, MCV 91,  otherwise CBC normal.    ASSESSMENT/PLAN:  Dementia.  Advanced.  Continue comfort measures.   Hypothyroidism.  Continue levothyroxine.    Chronic kidney disease stage II.  Reassess renal functions.   Anxiety.  Currently on Klonopin.    Check CBC and BMP.   I have reviewed patient's medical records received at admission/from hospitalization.  CPT CODE: 06301

## 2012-06-08 ENCOUNTER — Non-Acute Institutional Stay (SKILLED_NURSING_FACILITY): Payer: Medicare Other | Admitting: Internal Medicine

## 2012-06-08 DIAGNOSIS — M0230 Reiter's disease, unspecified site: Secondary | ICD-10-CM

## 2012-06-13 NOTE — Progress Notes (Signed)
Patient ID: [name], female   DOB: 11/30/1930, 79 y.o.   MRN: [id number]        PROGRESS NOTE  DATE: 05/23/2012  FACILITY:  [facility]  LEVEL OF CARE: SNF (31)  Acute Visit  CHIEF COMPLAINT:  Manage dementia and dysphagia.    HISTORY OF PRESENT ILLNESS: I was requested by the staff to assess the patient regarding above problem(s):  I was requested by the daughter to address the following problems:    DEMENTIA: The dementia remains stable and continues to function adequately in the current living environment with supervision.  The patient has had little changes in behavior. No complications noted from the medications presently being used.  Patient is a poor historian.  The dementia is secondary to vascular problems.  She is currently on Palliative Care.    DYSPHAGIA:  Unspecified.  In the hospital, it was thought to be from presbyesophagus.  Swallowing evaluation was not indicative of pharyngeal dysphagia.  Patient is on a dysphagia III diet and daughter would like to advance the diet.    PAST MEDICAL HISTORY : Reviewed.  No changes.  CURRENT MEDICATIONS: Reviewed per [facility]  REVIEW OF SYSTEMS:  Unobtainable due to advanced dementia.   PHYSICAL EXAMINATION  VS:  T        P       RR       BP      POX %       WT (Lb)  GENERAL: no acute distress, thin body habitus EYES: conjunctivae normal, sclerae normal, normal eye lids NECK: supple, trachea midline, no neck masses, no thyroid tenderness, no thyromegaly LYMPHATICS: no LAN in the neck, no supraclavicular LAN RESPIRATORY: breathing is even & unlabored, BS CTAB CARDIAC: RRR, no murmur,no extra heart sounds, no edema GI: abdomen soft, normal BS, no masses, no tenderness, no hepatomegaly, no splenomegaly PSYCHIATRIC: the patient is alert, disoriented, decreased affect and mood   LABS/RADIOLOGY: 05/22/2012:  Platelets 409, otherwise CBC normal.    BMP normal.   ASSESSMENT/PLAN:  Vascular dementia,  end-stage.  Explained to the daughter that she is end-stage and comfort care is appropriate measure.   Dysphagia.  We will obtain a Speech Therapy eval for reassessment of her dysphagia.    Hypoxia.  The patient is currently on oxygen.  Daughter would like to wean her off of oxygen.    Total patient care time:  Greater than 25 minutes which included discussion with daughter and son-in-law regarding above problems and palliative comfort care measures.  I also spoke with Palliative Care nurse practitioner and discussed the plan of treatment.  CPT CODE: 14782

## 2012-06-18 NOTE — Progress Notes (Signed)
Patient ID: [name], female   DOB: 1930-09-24, 79 y.o.   MRN: [id number]           PROGRESS NOTE  DATE:  06/08/2012  FACILITY: [facility]   LEVEL OF CARE:   SNF   Acute Visit   CHIEF COMPLAINT:  Pain in the right hand.    HISTORY OF PRESENT ILLNESS:  This is a patient with advanced dementia who was  apparently living in some form of memory unit.  She is blind and totally dependent.  She was admitted to hospital after having a choking episode.  She is felt to have advanced multiinfarct dementia and has generally been declining ever since.      She was noted by the staff to have pain in the right hand without overt trauma and I have been asked to look at her because of this.      REVIEW OF SYSTEMS:  Not possible.   PHYSICAL EXAMINATION:   GENERAL APPEARANCE:  The patient has very significant cognitive impairment.  Sitting flexed forward in her chair.  Notable for marked rigidity.   CHEST/RESPIRATORY:  Clear air entry bilaterally.    CARDIOVASCULAR:  CARDIAC:   Heart sounds are normal.  She does not appear to be overtly dehydrated.   MUSCULOSKELETAL:   EXTREMITIES:  RIGHT UPPER EXTREMITY:  Right hand:  There is indeed intense swelling.  She has acute arthritis across her metacarpophalangeals as well as the PIPs of all of her fingers, absent her thumb.  The DIPs are spared.  No other joints seem to be involved.    ASSESSMENT/PLAN:  Acute arthritis involving the MCPs and PIPs of her right hand without any other involvement.  Does not carry a relevant diagnosis.  The differential here is wide including crystal arthropathy, atypical rheumatoid arthritis, etc.  I will go ahead and x-ray the right hand to exclude a fracture.  Failing this, I am going to give her prednisone to see if we can settle this down.  CPT CODE: 56213

## 2012-06-25 NOTE — Progress Notes (Signed)
Patient ID: [name], female   DOB: 1930-12-25, 79 y.o.   MRN: [id number]        PROGRESS NOTE  DATE: 06/04/2012  FACILITY:  [facility]  LEVEL OF CARE: SNF (31)  Acute Visit  CHIEF COMPLAINT:  Manage altered mental status.    HISTORY OF PRESENT ILLNESS: I was requested by the staff to assess the patient regarding above problem(s):  ALTERED MENTAL STATUS:  Patient's daughter is very concerned that patient is extremely lethargic and sleeping most of the time.  She would like her Klonopin discontinued.  Patient is a poor historian and does not follow commands.    PAST MEDICAL HISTORY : Reviewed.  No changes.  CURRENT MEDICATIONS: Reviewed per [facility]  REVIEW OF SYSTEMS:  Unobtainable.  Patient does not follow commands.    PHYSICAL EXAMINATION  GENERAL: no acute distress, normal body habitus EYES: conjunctivae normal, sclerae normal, normal eye lids NECK: supple, trachea midline, no neck masses, no thyroid tenderness, no thyromegaly LYMPHATICS: no LAN in the neck, no supraclavicular LAN RESPIRATORY: breathing is even & unlabored, BS CTAB CARDIAC: RRR, no murmur,no extra heart sounds, no edema GI: abdomen soft, normal BS, no masses, no tenderness, no hepatomegaly, no splenomegaly PSYCHIATRIC: the patient is alert, disoriented, very lethargic  ASSESSMENT/PLAN:  Altered mental status.   Per daughter, new problem.  Advised to her that discontinuing Klonopin may exacerbate her psychiatric issues.  Therefore, decrease Klonopin to 0.5 mg q.h.s. only and also decrease Zyprexa to 2.5 mg q.d.    I spoke with patient's daughter at bedside and discussed above medication adjustments.    CPT CODE: 30865

## 2012-06-27 ENCOUNTER — Non-Acute Institutional Stay (SKILLED_NURSING_FACILITY): Payer: Medicare Other | Admitting: Internal Medicine

## 2012-06-27 DIAGNOSIS — E039 Hypothyroidism, unspecified: Secondary | ICD-10-CM

## 2012-06-27 DIAGNOSIS — M1990 Unspecified osteoarthritis, unspecified site: Secondary | ICD-10-CM

## 2012-06-27 DIAGNOSIS — K5900 Constipation, unspecified: Secondary | ICD-10-CM

## 2012-06-27 DIAGNOSIS — F0150 Vascular dementia without behavioral disturbance: Secondary | ICD-10-CM

## 2012-06-29 DIAGNOSIS — F0150 Vascular dementia without behavioral disturbance: Secondary | ICD-10-CM | POA: Insufficient documentation

## 2012-06-29 DIAGNOSIS — K5900 Constipation, unspecified: Secondary | ICD-10-CM | POA: Insufficient documentation

## 2012-06-29 NOTE — Progress Notes (Signed)
PROGRESS NOTE  DATE: 06/27/2012  FACILITY: Nursing Home Location: [facility]  LEVEL OF CARE: SNF (31)  Routine Visit  CHIEF COMPLAINT:  Manage hypothyroidism, vascular dementia and constipation  HISTORY OF PRESENT ILLNESS:  REASSESSMENT OF ONGOING PROBLEM(S):  DEMENTIA: The dementia remaines stable and continues to function adequately in the current living environment with supervision.  The patient has had little changes in behavior. No complications noted from the medications presently being used. Patient is a poor historian.  HYPOTHYROIDISM: The hypothyroidism remains stable. No complications noted from the medications presently being used.  The staff deny fatigue or constipation.  Last TSH not available.  CONSTIPATION: The constipation remains stable. No complications from the medications presently being used. Staff deny ongoing constipation, abdominal pain, nausea or vomiting.  PAST MEDICAL HISTORY : Reviewed.  No changes.  CURRENT MEDICATIONS: Reviewed per [facility]  REVIEW OF SYSTEMS: Unobtainable due to dementia.  PHYSICAL EXAMINATION  VS:  T 98.4       P 80      RR 20     BP 109/58     POX %     WT (Lb) 109  GENERAL: no acute distress, normal body habitus EYES: conjunctivae normal, sclerae normal, normal eye lids NECK: supple, trachea midline, no neck masses, no thyroid tenderness, no thyromegaly LYMPHATICS: no LAN in the neck, no supraclavicular LAN RESPIRATORY: breathing is even & unlabored, BS CTAB CARDIAC: RRR, no murmur,no extra heart sounds, no edema GI: abdomen soft, normal BS, no masses, no tenderness, no hepatomegaly, no splenomegaly PSYCHIATRIC: the patient is alert & disoriented, affect & behavior appropriate  LABS/RADIOLOGY:  5/14 platelets 409 otherwise CBC normal, BMP normal  ASSESSMENT/PLAN:  Hypothyroidism-check TSH. Vascular dementia-advanced. check vitamin B12 level. Constipation-continue current  laxatives. Osteoarthritis-patient appears comfortable. Anxiety-stable Check liver profile.  CPT CODE: 16109

## 2012-08-01 ENCOUNTER — Non-Acute Institutional Stay (SKILLED_NURSING_FACILITY): Payer: Medicare Other | Admitting: Internal Medicine

## 2012-08-01 DIAGNOSIS — M1990 Unspecified osteoarthritis, unspecified site: Secondary | ICD-10-CM

## 2012-08-01 DIAGNOSIS — K5900 Constipation, unspecified: Secondary | ICD-10-CM

## 2012-08-01 DIAGNOSIS — F0150 Vascular dementia without behavioral disturbance: Secondary | ICD-10-CM

## 2012-08-01 DIAGNOSIS — E039 Hypothyroidism, unspecified: Secondary | ICD-10-CM

## 2012-08-01 NOTE — Progress Notes (Signed)
PROGRESS NOTE  DATE: 08-01-12  FACILITY: Nursing Home [facility]: [facility]  LEVEL OF CARE: SNF (31)  Routine Visit  CHIEF COMPLAINT:  Manage hypothyroidism, vascular dementia and constipation  HISTORY OF PRESENT ILLNESS:  REASSESSMENT OF ONGOING PROBLEM(S):  DEMENTIA: The dementia remaines stable and continues to function adequately in the current living environment with supervision.  The patient has had little changes in behavior. No complications noted from the medications presently being used. Patient is a poor historian.  HYPOTHYROIDISM: The hypothyroidism remains stable. No complications noted from the medications presently being used.  The staff deny fatigue or constipation.  Last TSH 2.41 in 6/14.  CONSTIPATION: The constipation remains stable. No complications from the medications presently being used. Staff deny ongoing constipation, abdominal pain, nausea or vomiting.  PAST MEDICAL HISTORY : Reviewed.  No changes.  CURRENT MEDICATIONS: Reviewed per [facility]  REVIEW OF SYSTEMS: Unobtainable due to dementia.  PHYSICAL EXAMINATION  VS:  T 97.4       P 80      RR 20     BP 126/66     POX %     WT (Lb) 104  GENERAL: no acute distress, normal body habitus NECK: supple, trachea midline, no neck masses, no thyroid tenderness, no thyromegaly RESPIRATORY: breathing is even & unlabored, BS CTAB CARDIAC: RRR, no murmur,no extra heart sounds, no edema GI: abdomen soft, normal BS, no masses, no tenderness, no hepatomegaly, no splenomegaly PSYCHIATRIC: the patient is alert & disoriented, affect & behavior appropriate  LABS/RADIOLOGY:  6/14 albumin 3.4 otherwise liver profile normal, vitamin B12 level 432, folate greater than 19.9  5/14 platelets 409 otherwise CBC normal, BMP normal  ASSESSMENT/PLAN:  Hypothyroidism-well controlled. Vascular dementia-advanced.  Constipation-continue current laxatives. Osteoarthritis-patient appears  comfortable. Anxiety-stable Check hemoglobin A1c.  CPT CODE: 54098

## 2012-09-11 ENCOUNTER — Other Ambulatory Visit: Payer: Self-pay | Admitting: *Deleted

## 2012-09-11 MED ORDER — MORPHINE SULFATE (CONCENTRATE) 20 MG/ML PO SOLN: ORAL | Status: AC

## 2012-09-28 ENCOUNTER — Non-Acute Institutional Stay (SKILLED_NURSING_FACILITY): Payer: Medicare Other | Admitting: Internal Medicine

## 2012-09-28 DIAGNOSIS — K5900 Constipation, unspecified: Secondary | ICD-10-CM

## 2012-09-28 DIAGNOSIS — E039 Hypothyroidism, unspecified: Secondary | ICD-10-CM

## 2012-09-28 DIAGNOSIS — M1990 Unspecified osteoarthritis, unspecified site: Secondary | ICD-10-CM

## 2012-09-28 DIAGNOSIS — F0150 Vascular dementia without behavioral disturbance: Secondary | ICD-10-CM

## 2012-09-29 NOTE — Progress Notes (Signed)
PROGRESS NOTE  DATE: 09-28-12  FACILITY: Nursing Home Location: [facility]  LEVEL OF CARE: SNF (31)  Routine Visit  CHIEF COMPLAINT:  Manage hypothyroidism, vascular dementia and constipation  HISTORY OF PRESENT ILLNESS:  REASSESSMENT OF ONGOING PROBLEM(S):  DEMENTIA: The dementia remaines stable and continues to function adequately in the current living environment with supervision.  The patient has had little changes in behavior. No complications noted from the medications presently being used. Patient is a poor historian.  HYPOTHYROIDISM: The hypothyroidism remains stable. No complications noted from the medications presently being used.  The staff deny fatigue or constipation.  Last TSH 2.41 in 6/14.  CONSTIPATION: The constipation remains stable. No complications from the medications presently being used. Staff deny ongoing constipation, abdominal pain, nausea or vomiting.  PAST MEDICAL HISTORY : Reviewed.  No changes.  CURRENT MEDICATIONS: Reviewed per [facility]  REVIEW OF SYSTEMS: Unobtainable due to dementia.  PHYSICAL EXAMINATION  VS:  T 97.5       P 88      RR 18     BP 118/68     POX %     WT (Lb) 101  GENERAL: no acute distress, normal body habitus NECK: supple, trachea midline, no neck masses, no thyroid tenderness, no thyromegaly RESPIRATORY: breathing is even & unlabored, BS CTAB CARDIAC: RRR, no murmur,no extra heart sounds, no edema GI: abdomen soft, normal BS, no masses, no tenderness, no hepatomegaly, no splenomegaly PSYCHIATRIC: the patient is alert & disoriented, affect & behavior appropriate  LABS/RADIOLOGY:  7-14 hemoglobin A1c 5.6  6/14 albumin 3.4 otherwise liver profile normal, vitamin B12 level 432, folate greater than 19.9  5/14 platelets 409 otherwise CBC normal, BMP normal  ASSESSMENT/PLAN:  Hypothyroidism-well controlled. Vascular dementia-advanced. Zyprexa was discontinued. Patient is hospice. Constipation-continue  current laxatives. Osteoarthritis-patient appears comfortable. Anxiety-stable  CPT CODE: 96045

## 2012-11-08 ENCOUNTER — Non-Acute Institutional Stay (SKILLED_NURSING_FACILITY): Payer: Medicare Other | Admitting: Internal Medicine

## 2012-11-08 DIAGNOSIS — E039 Hypothyroidism, unspecified: Secondary | ICD-10-CM

## 2012-11-08 DIAGNOSIS — K5900 Constipation, unspecified: Secondary | ICD-10-CM

## 2012-11-08 DIAGNOSIS — F0150 Vascular dementia without behavioral disturbance: Secondary | ICD-10-CM

## 2012-11-08 DIAGNOSIS — M1990 Unspecified osteoarthritis, unspecified site: Secondary | ICD-10-CM

## 2012-11-09 NOTE — Progress Notes (Signed)
PROGRESS NOTE  DATE: 11-08-12  FACILITY: Nursing Home [facility]: [facility]  LEVEL OF CARE: SNF (31)  Routine Visit  CHIEF COMPLAINT:  Manage hypothyroidism, vascular dementia and constipation  HISTORY OF PRESENT ILLNESS:  REASSESSMENT OF ONGOING PROBLEM(S):  DEMENTIA: The dementia remaines stable and continues to function adequately in the current living environment with supervision.  The patient has had little changes in behavior. No complications noted from the medications presently being used. Patient is a poor historian.  HYPOTHYROIDISM: The hypothyroidism remains stable. No complications noted from the medications presently being used.  The staff deny fatigue or constipation.  Last TSH 2.41 in 6/14.  CONSTIPATION: The constipation remains stable. No complications from the medications presently being used. Staff deny ongoing constipation, abdominal pain, nausea or vomiting.  PAST MEDICAL HISTORY : Reviewed.  No changes.  CURRENT MEDICATIONS: Reviewed per [facility]  REVIEW OF SYSTEMS: Unobtainable due to dementia.  PHYSICAL EXAMINATION  VS:  T 97.9       P 82      RR 20     BP 142/70     POX %     WT (Lb) 101  GENERAL: no acute distress, normal body habitus NECK: supple, trachea midline, no neck masses, no thyroid tenderness, no thyromegaly RESPIRATORY: breathing is even & unlabored, BS CTAB CARDIAC: RRR, no murmur,no extra heart sounds, no edema GI: abdomen soft, normal BS, no masses, no tenderness, no hepatomegaly, no splenomegaly PSYCHIATRIC: the patient is alert & disoriented, affect & behavior appropriate  LABS/RADIOLOGY:  7-14 hemoglobin A1c 5.6  6/14 albumin 3.4 otherwise liver profile normal, vitamin B12 level 432, folate greater than 19.9  5/14 platelets 409 otherwise CBC normal, BMP normal  ASSESSMENT/PLAN:  Hypothyroidism-well controlled. Vascular dementia-advanced. Patient is hospice. Constipation-continue current  laxatives. Osteoarthritis-patient appears comfortable. Anxiety-stable  CPT CODE: 56213

## 2012-11-29 ENCOUNTER — Other Ambulatory Visit: Payer: Self-pay | Admitting: *Deleted

## 2012-11-29 MED ORDER — CLONAZEPAM 0.5 MG PO TABS: ORAL_TABLET | ORAL | Status: DC

## 2013-01-01 ENCOUNTER — Other Ambulatory Visit: Payer: Self-pay | Admitting: *Deleted

## 2013-01-01 ENCOUNTER — Encounter: Payer: Self-pay | Admitting: Internal Medicine

## 2013-01-01 ENCOUNTER — Non-Acute Institutional Stay (SKILLED_NURSING_FACILITY): Payer: Medicare Other | Admitting: Internal Medicine

## 2013-01-01 DIAGNOSIS — F411 Generalized anxiety disorder: Secondary | ICD-10-CM

## 2013-01-01 DIAGNOSIS — F0150 Vascular dementia without behavioral disturbance: Secondary | ICD-10-CM

## 2013-01-01 DIAGNOSIS — E039 Hypothyroidism, unspecified: Secondary | ICD-10-CM

## 2013-01-01 DIAGNOSIS — K5900 Constipation, unspecified: Secondary | ICD-10-CM

## 2013-01-01 MED ORDER — CLONAZEPAM 0.5 MG PO TABS: ORAL_TABLET | ORAL | Status: AC

## 2013-01-01 NOTE — Progress Notes (Signed)
PROGRESS NOTE  DATE: 01-01-13  FACILITY: Nursing Home Location: [facility]  LEVEL OF CARE: SNF (31)  Routine Visit  CHIEF COMPLAINT:  Manage hypothyroidism, anxiety, vascular dementia and constipation  HISTORY OF PRESENT ILLNESS:  REASSESSMENT OF ONGOING PROBLEM(S):  DEMENTIA: The dementia remaines stable and continues to function adequately in the current living environment with supervision.  The patient has had little changes in behavior. No complications noted from the medications presently being used. Patient is a poor historian.  HYPOTHYROIDISM: The hypothyroidism remains stable. No complications noted from the medications presently being used.  The staff deny fatigue or constipation.  Last TSH 2.41 in 6/14.  CONSTIPATION: The constipation remains stable. No complications from the medications presently being used. Staff deny ongoing constipation, abdominal pain, nausea or vomiting.  ANXIETY: The anxiety remains stable. Staff deny ongoing anxiety or irritability. No complications reported from the medications currently being used. I was requested by the pharmacy consult and to attempt a dose reduction on of her klonopin.  PAST MEDICAL HISTORY : Reviewed.  No changes.  CURRENT MEDICATIONS: Reviewed per [facility]  REVIEW OF SYSTEMS: Unobtainable due to dementia.  PHYSICAL EXAMINATION  VS:  T 99       P 84      RR 20     BP 132/81     POX %     WT (Lb) 99  GENERAL: no acute distress, normal body habitus EYES: Unable to assess NECK: supple, trachea midline, no neck masses, no thyroid tenderness, no thyromegaly LYMPHATICS: No cervical lymphadenopathy, no supraclavicular lymphadenopathy RESPIRATORY: breathing is even & unlabored, BS CTAB CARDIAC: RRR, no murmur,no extra heart sounds, no edema GI: abdomen soft, normal BS, no masses, no tenderness, no hepatomegaly, no splenomegaly PSYCHIATRIC: the patient is alert & disoriented, affect & behavior  appropriate  LABS/RADIOLOGY: 12- 14 CBC normal, total protein 5.6 otherwise CMP normal, vitamin D level 39.1 7-14 hemoglobin A1c 5.6  6/14 albumin 3.4 otherwise liver profile normal, vitamin B12 level 432, folate greater than 19.9  5/14 platelets 409 otherwise CBC normal, BMP normal  ASSESSMENT/PLAN:  Hypothyroidism-well controlled. Vascular dementia-advanced. Patient is hospice. Constipation-continue current laxatives. Osteoarthritis-patient appears comfortable. Anxiety-stable. Decrease clonidine to 0.25 mg each bedtime  CPT CODE: 30865

## 2013-01-22 ENCOUNTER — Non-Acute Institutional Stay (SKILLED_NURSING_FACILITY): Payer: Medicare Other | Admitting: Internal Medicine

## 2013-01-22 DIAGNOSIS — F0280 Dementia in other diseases classified elsewhere without behavioral disturbance: Secondary | ICD-10-CM

## 2013-01-22 DIAGNOSIS — E039 Hypothyroidism, unspecified: Secondary | ICD-10-CM

## 2013-01-22 DIAGNOSIS — G309 Alzheimer's disease, unspecified: Principal | ICD-10-CM

## 2013-01-22 DIAGNOSIS — K5900 Constipation, unspecified: Secondary | ICD-10-CM

## 2013-01-22 DIAGNOSIS — F411 Generalized anxiety disorder: Secondary | ICD-10-CM

## 2013-01-22 NOTE — Progress Notes (Signed)
PROGRESS NOTE  DATE: 01-22-13  FACILITY: Nursing Home Location: [facility]  LEVEL OF CARE: SNF (31)  Routine Visit  CHIEF COMPLAINT:  Manage hypothyroidism, anxiety,  dementia and constipation  HISTORY OF PRESENT ILLNESS:  REASSESSMENT OF ONGOING PROBLEM(S):  DEMENTIA: The dementia remaines stable and continues to function adequately in the current living environment with supervision.  The patient has had little changes in behavior. No complications noted from the medications presently being used. Patient is a poor historian.  HYPOTHYROIDISM: The hypothyroidism remains stable. No complications noted from the medications presently being used.  The staff deny fatigue or constipation.  Last TSH 2.41 in 6/14.  CONSTIPATION: The constipation remains stable. No complications from the medications presently being used. Staff deny ongoing constipation, abdominal pain, nausea or vomiting.  ANXIETY: The anxiety remains stable. Staff deny ongoing anxiety or irritability. No complications reported from the medications currently being used.  PAST MEDICAL HISTORY : Reviewed.  No changes.  [facility]  REVIEW OF SYSTEMS: Unobtainable due to dementia.  PHYSICAL EXAMINATION  VS:  T 97.7       P 78      RR 16     BP 120/60    WT (Lb) 99  GENERAL: no acute distress, normal body habitus NECK: supple, trachea midline, no neck masses, no thyroid tenderness, no thyromegaly RESPIRATORY: breathing is even & unlabored, BS CTAB CARDIAC: RRR, no murmur,no extra heart sounds, no edema GI: abdomen soft, normal BS, no masses, no tenderness, no hepatomegaly, no splenomegaly PSYCHIATRIC: the patient is alert & disoriented, affect & behavior appropriate  LABS/RADIOLOGY: 12- 14 CBC normal, total protein 5.6 otherwise CMP normal, vitamin D level 39.1 7-14 hemoglobin A1c 5.6  6/14 albumin 3.4 otherwise liver profile normal, vitamin B12 level 432, folate greater  than 19.9  5/14 platelets 409 otherwise CBC normal, BMP normal  ASSESSMENT/PLAN:  Hypothyroidism-well controlled. Check TSH Alzheimer's dementia-advanced. Patient is hospice. Constipation-continue current laxatives. Osteoarthritis-patient appears comfortable. Anxiety-stable. Decrease clonidine to 0.25 mg each bedtime  CPT CODE: [id number]

## 2013-01-29 ENCOUNTER — Non-Acute Institutional Stay (SKILLED_NURSING_FACILITY): Payer: Medicare Other | Admitting: Internal Medicine

## 2013-01-29 DIAGNOSIS — E039 Hypothyroidism, unspecified: Secondary | ICD-10-CM

## 2013-01-29 NOTE — Progress Notes (Signed)
         PROGRESS NOTE  DATE: 01/29/2013  [facility]  LEVEL OF CARE: SNF (31)  Acute Visit  CHIEF COMPLAINT:  Manage hypothyroidism  HISTORY OF PRESENT ILLNESS: I was requested by the staff to assess the patient regarding above problem(s):  HYPOTHYROIDISM: The hypothyroidism is unstable. No complications noted from the medications presently being used.  The staff deny fatigue or constipation.  Last TSH 86.94. Patient does not follow commands due to advanced dementia.  PAST MEDICAL HISTORY : Reviewed.  No changes.  [facility]S: [facility]  REVIEW OF SYSTEMS: Unobtainable due to dementia  PHYSICAL EXAMINATION  GENERAL: no acute distress, thin body habitus NECK: supple, trachea midline, no neck masses, no thyroid tenderness, no thyromegaly RESPIRATORY: breathing is even & unlabored, BS CTAB CARDIAC: RRR, no murmur,no extra heart sounds, no edema GI: abdomen soft, normal BS, no masses, no tenderness, no hepatomegaly, no splenomegaly  LABS/RADIOLOGY: See history of present illness  ASSESSMENT/PLAN:  Hypothyroidism-unstable problem. Increase levothyroxine 150 mcg daily. Check TSH in 6 weeks.  CPT CODE: [id number]

## 2013-01-29 NOTE — Addendum Note (Signed)
Addended by: Angela CoxASANAYAKA, GAYANI Y on: 01/29/2013 08:22 PM   Modules accepted: Level of Service

## 2013-02-11 IMAGING — CT CT CERVICAL SPINE W/O CM
3 of 9 series · 10 of 33 positions shown, 12 images · non-contrast
Comparison: CT head 12/22/2011 and MR brain 12/22/2011

CT HEAD

CLINICAL DATA: Fall

CT HEAD WITHOUT CONTRAST
CT MAXILLOFACIAL WITHOUT CONTRAST
CT CERVICAL SPINE WITHOUT CONTRAST
TECHNIQUE: Multidetector CT imaging of the head, cervical spine,
and maxillofacial structures were performed using the standard
protocol without intravenous contrast. Multiplanar CT image
reconstructions of the cervical spine and maxillofacial structures
were also generated.

[Series 5: recon 2: supine facial bones · axial · 0.35mm/px · z∈[-225,-58]mm · 2 of 68 slices shown, 3 images]
[im 1/68  soft-tissue]
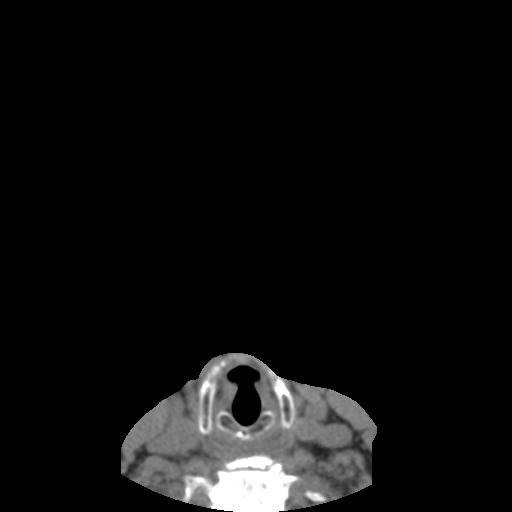
[im 1/68  bone]
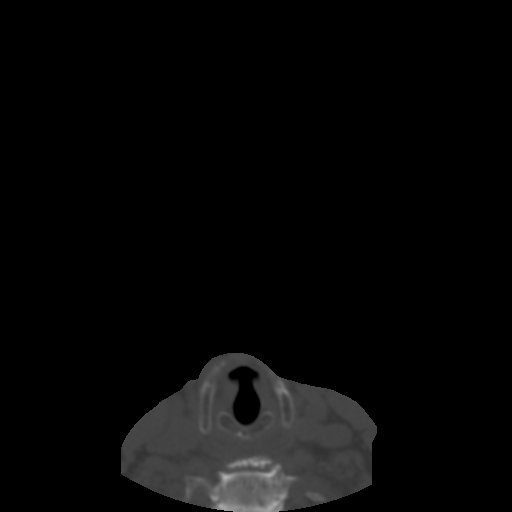
[im 68/68  bone]
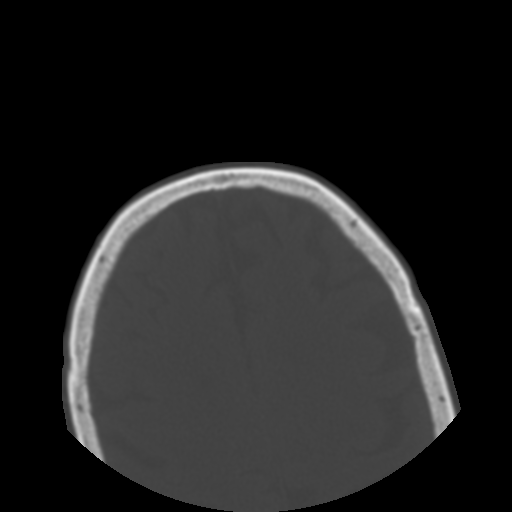

[Series 900: sagittal · sagittal · 0.32mm/px · 5 of 33 slices shown, 6 images]
[im 11/33  bone]
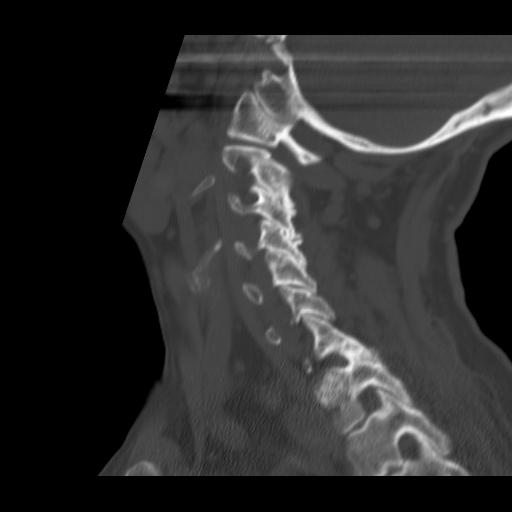
[im 14/33  bone]
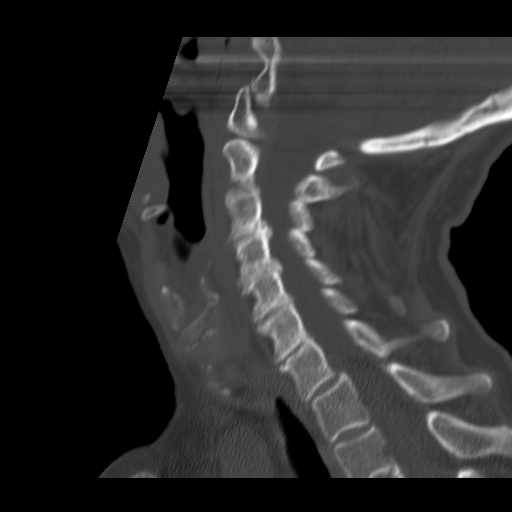
[im 17/33  soft-tissue]
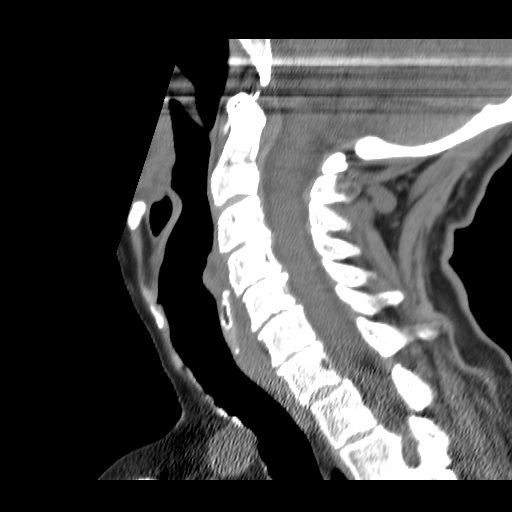
[im 17/33  bone]
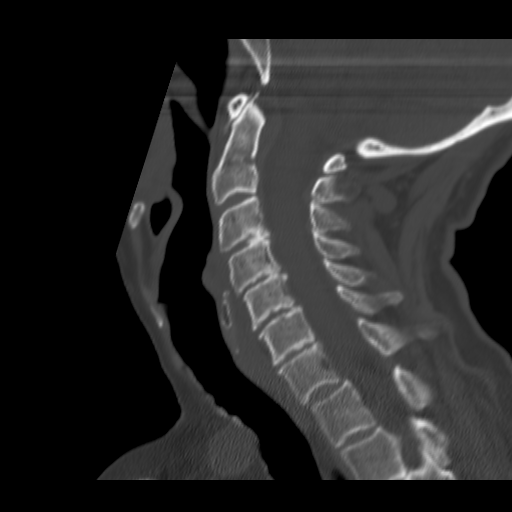
[im 19/33  bone]
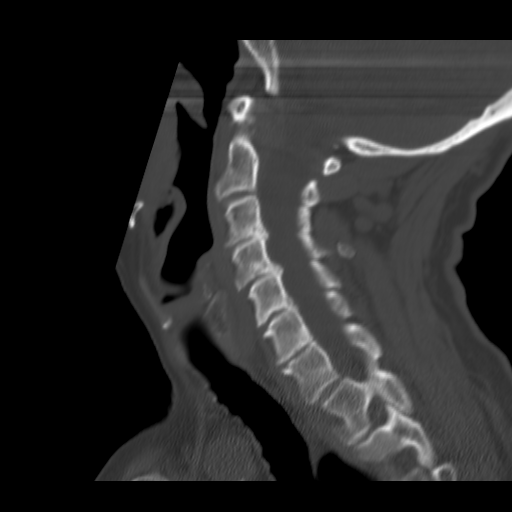
[im 22/33  bone]
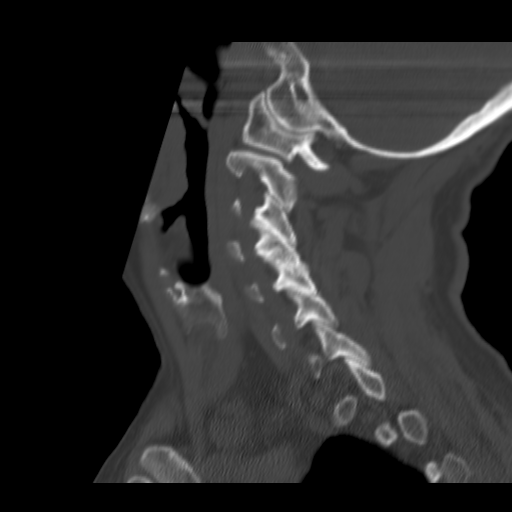

[Series 901: coronal · coronal · 0.32mm/px · 3 of 33 slices shown]
[im 8/33  bone]
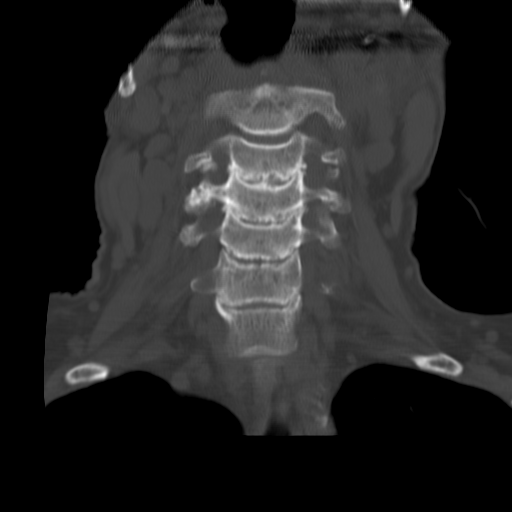
[im 14/33  bone]
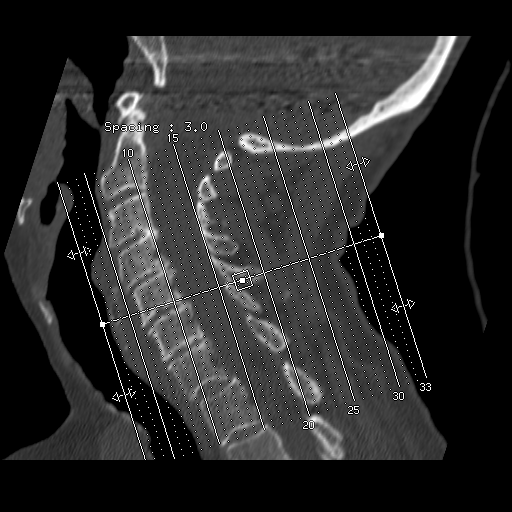
[im 19/33  bone]
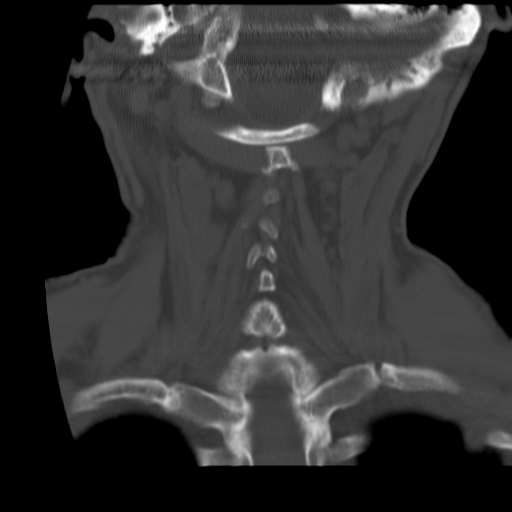

[10 of 33 positions shown; findings below may reference images not displayed]

FINDINGS: Stable cerebral atrophy and mild periventricular chronic
white matter ischemic changes. Ventricles remain mildly prominent,
likely related to cerebral atrophy.  A mild degree of hydrocephalus
cannot be excluded, as described on recent brain MRI.  The
ventricular size is stable.  No negative for hemorrhage, mass
effect, mass lesion, or evidence of acute cortically based
infarction.

There is a right pre-orbital and right frontal scalp hematoma.  The
right globe and lens are intact.  There postsurgical changes of the
lateral left orbit, stable.  The skull is intact.
IMPRESSION: 1.  Right frontal scalp/right periorbital hematoma.
2.  No acute intracranial abnormality.
3.  Chronic atrophy and chronic microvascular ischemic changes.
Stable mild ventriculomegaly.

CT MAXILLOFACIAL
FINDINGS: There is a right frontal and right pre-orbital hematoma.
The facial bones are intact.  Specifically, there is no evidence of
a right frontal bone or right orbital fracture.  The mandibular
condyles are located.  There is streak artifact from dental
hardware bilaterally.  The paranasal sinuses are clear.
IMPRESSION: 1.  Right frontal and right preorbital hematoma.
2.  Negative for acute fracture.
3.  Clear sinuses.

CT CERVICAL SPINE
FINDINGS: Cervical spine is imaged from the skull base through
the T2 vertebral body.  There is 2 mm anterolisthesis of C7 on T1,
likely related to facet joint degenerative change.  The remainder
of the cervical spine vertebral bodies are normally aligned.  There
is disc height narrowing at C3-4, C4-5, C5-6, and C6-7.   No acute
fracture is identified.  The prevertebral soft tissue contour is
normal.  The spinal canal is patent. Lung apices are clear.
IMPRESSION: 1.  No acute bony abnormality.
2.  2 mm anterolisthesis of C7 on T1 is likely due to facet joint
degenerative change at this level.

## 2013-04-17 DEATH — deceased
